# Patient Record
Sex: Female | Born: 1960 | Race: White | Hispanic: No | Marital: Married | Smoking: Current every day smoker
Health system: Southern US, Community
[De-identification: ages and names within clinical notes are randomized; demographics above are authoritative.]

## PROBLEM LIST (undated history)

## (undated) DIAGNOSIS — J449 Chronic obstructive pulmonary disease, unspecified: Secondary | ICD-10-CM

## (undated) DIAGNOSIS — I10 Essential (primary) hypertension: Secondary | ICD-10-CM

## (undated) DIAGNOSIS — T7840XA Allergy, unspecified, initial encounter: Secondary | ICD-10-CM

## (undated) DIAGNOSIS — I639 Cerebral infarction, unspecified: Secondary | ICD-10-CM

## (undated) HISTORY — DX: Allergy, unspecified, initial encounter: T78.40XA

## (undated) HISTORY — DX: Essential (primary) hypertension: I10

---

## 1995-11-24 HISTORY — PX: TUBAL LIGATION: SHX77

## 2005-06-01 ENCOUNTER — Ambulatory Visit: Payer: Self-pay | Admitting: Internal Medicine

## 2005-07-20 ENCOUNTER — Ambulatory Visit: Payer: Self-pay | Admitting: Internal Medicine

## 2007-02-25 DIAGNOSIS — G43809 Other migraine, not intractable, without status migrainosus: Secondary | ICD-10-CM | POA: Insufficient documentation

## 2007-11-24 HISTORY — PX: ABDOMINAL HYSTERECTOMY: SHX81

## 2008-03-12 ENCOUNTER — Ambulatory Visit: Payer: Self-pay

## 2008-03-15 ENCOUNTER — Ambulatory Visit: Payer: Self-pay

## 2008-12-11 DIAGNOSIS — F17200 Nicotine dependence, unspecified, uncomplicated: Secondary | ICD-10-CM | POA: Insufficient documentation

## 2010-01-30 ENCOUNTER — Ambulatory Visit: Payer: Self-pay | Admitting: Family Medicine

## 2010-02-20 DIAGNOSIS — G5600 Carpal tunnel syndrome, unspecified upper limb: Secondary | ICD-10-CM | POA: Insufficient documentation

## 2010-09-26 ENCOUNTER — Ambulatory Visit: Payer: Self-pay | Admitting: Specialist

## 2010-11-06 ENCOUNTER — Ambulatory Visit: Payer: Self-pay | Admitting: Specialist

## 2011-04-29 ENCOUNTER — Inpatient Hospital Stay: Payer: Self-pay | Admitting: Internal Medicine

## 2011-05-08 ENCOUNTER — Ambulatory Visit: Payer: Self-pay | Admitting: Family Medicine

## 2012-12-20 ENCOUNTER — Ambulatory Visit: Payer: Self-pay | Admitting: Family Medicine

## 2014-07-23 LAB — CBC AND DIFFERENTIAL
HEMATOCRIT: 40 % (ref 36–46)
HEMOGLOBIN: 14 g/dL (ref 12.0–16.0)
Neutrophils Absolute: 61 /uL
Platelets: 208 10*3/uL (ref 150–399)
WBC: 8.2 10^3/mL

## 2014-07-23 LAB — HEPATIC FUNCTION PANEL
ALT: 15 U/L (ref 7–35)
AST: 18 U/L (ref 13–35)
Alkaline Phosphatase: 101 U/L (ref 25–125)
Bilirubin, Total: 0.3 mg/dL

## 2014-07-23 LAB — BASIC METABOLIC PANEL
BUN: 13 mg/dL (ref 4–21)
Creatinine: 0.9 mg/dL (ref 0.5–1.1)
GLUCOSE: 86 mg/dL
POTASSIUM: 4 mmol/L (ref 3.4–5.3)
SODIUM: 140 mmol/L (ref 137–147)

## 2015-09-11 ENCOUNTER — Other Ambulatory Visit: Payer: Self-pay | Admitting: Family Medicine

## 2015-09-17 ENCOUNTER — Other Ambulatory Visit: Payer: Self-pay | Admitting: Family Medicine

## 2015-09-18 ENCOUNTER — Other Ambulatory Visit: Payer: Self-pay | Admitting: Family Medicine

## 2015-09-18 NOTE — Telephone Encounter (Signed)
Pt contacted office for refill request on the following medications: Pt request 90 day supply.  [facility]  [address] .  CB#[phone]/MW   Metoprolol Tartrate 50mg    Amlodipine Besylate 10mg 

## 2015-09-19 ENCOUNTER — Other Ambulatory Visit: Payer: Self-pay

## 2015-09-19 NOTE — Telephone Encounter (Addendum)
[facility] 50 [facility] and Amlodipine 10 mg.

## 2015-09-20 MED ORDER — METOPROLOL TARTRATE 50 MG PO TABS: 50.0000 mg | ORAL_TABLET | Freq: Two times a day (BID) | ORAL | Status: DC

## 2015-09-20 MED ORDER — AMLODIPINE BESYLATE 10 MG PO TABS: 10.0000 mg | ORAL_TABLET | Freq: Every day | ORAL | Status: DC

## 2015-09-20 NOTE — Telephone Encounter (Signed)
Refilled Metoprolol Tartrate 50 mg BID #180 and Amlodipine Besylate 10 mg qd #90. Advise patient to schedule follow up appointment in the next 1-2 months.

## 2015-09-23 NOTE — Telephone Encounter (Signed)
Patient advised as directed below. Patient verbalized understanding and states she will call back to schedule a follow up appointment.  

## 2015-12-19 DIAGNOSIS — R636 Underweight: Secondary | ICD-10-CM | POA: Insufficient documentation

## 2015-12-19 DIAGNOSIS — I10 Essential (primary) hypertension: Secondary | ICD-10-CM | POA: Insufficient documentation

## 2015-12-19 DIAGNOSIS — IMO0002 Reserved for concepts with insufficient information to code with codable children: Secondary | ICD-10-CM | POA: Insufficient documentation

## 2015-12-19 DIAGNOSIS — B029 Zoster without complications: Secondary | ICD-10-CM | POA: Insufficient documentation

## 2015-12-23 ENCOUNTER — Encounter: Payer: Self-pay | Admitting: Family Medicine

## 2015-12-23 ENCOUNTER — Ambulatory Visit (INDEPENDENT_AMBULATORY_CARE_PROVIDER_SITE_OTHER): Payer: BLUE CROSS/BLUE SHIELD | Admitting: Family Medicine

## 2015-12-23 VITALS — BP 162/92 | HR 61 | Temp 98.4°F | Resp 14 | Wt 108.4 lb

## 2015-12-23 DIAGNOSIS — Z23 Encounter for immunization: Secondary | ICD-10-CM | POA: Diagnosis not present

## 2015-12-23 DIAGNOSIS — I10 Essential (primary) hypertension: Secondary | ICD-10-CM

## 2015-12-23 DIAGNOSIS — R636 Underweight: Secondary | ICD-10-CM | POA: Diagnosis not present

## 2015-12-23 MED ORDER — CYPROHEPTADINE HCL 4 MG PO TABS: 4.0000 mg | ORAL_TABLET | Freq: Three times a day (TID) | ORAL | Status: DC | PRN

## 2015-12-23 MED ORDER — AMLODIPINE BESYLATE 10 MG PO TABS: 10.0000 mg | ORAL_TABLET | Freq: Every day | ORAL | Status: DC

## 2015-12-23 MED ORDER — METOPROLOL TARTRATE 50 MG PO TABS
50.0000 mg | ORAL_TABLET | Freq: Two times a day (BID) | ORAL | Status: DC
Start: 2015-12-23 — End: 2017-01-25

## 2015-12-23 NOTE — Progress Notes (Signed)
Patient ID: Leah Shaw, female   DOB: 07-21-1961, 55 y.o.   MRN: [id number]   Patient: Leah Shaw Female    DOB: 1961-02-20   55 y.o.   MRN: [id number] Visit Date: 12/23/2015  Today's Provider: Dortha Kern, PA   Chief Complaint  Patient presents with  . Hypertension  . Follow-up   Subjective:    HPI  Hypertension, follow-up:  BP Readings from Last 3 Encounters:  12/23/15 162/92    She was last seen for hypertension 06/25/2014  BP at that visit was 130/84. Management changes since that visit include none. She reports excellent compliance with treatment. She is not having side effects.  She is not exercising. She is adherent to low salt diet.   Outside blood pressures are pt is not checking blood pressure at home. She is experiencing none.  Patient denies none.   Cardiovascular risk factors include hypertension and smoking/ tobacco exposure.  Use of agents associated with hypertension: none.     Weight trend: stable Wt Readings from Last 3 Encounters:  12/23/15 108 lb 6.4 oz (49.17 kg)   Current diet: in general, a "healthy" diet    ------------------------------------------------------------------------ Patient Active Problem List   Diagnosis Date Noted  . 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 History of endocrine, metabolic or immunity disorder 78/29/5621  . BP (high blood pressure) 12/19/2015  . Low weight 12/19/2015  . Carpal tunnel syndrome 02/20/2010  . 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 Tubal ligation  1997  . Abdominal hysterectomy  2009   Family History  Problem Relation Age of Onset  . CAD Mother   . Heart disease Mother   . Heart attack Father   . Heart attack Maternal Grandmother   . Emphysema Paternal Grandmother      Previous Medications   AMLODIPINE (NORVASC) 10 MG TABLET    Take 1 tablet (10 mg total) by mouth daily.   ASPIRIN 81 MG  TABLET    Take by mouth.   CYPROHEPTADINE (PERIACTIN) 4 MG TABLET    Take by mouth.   METOPROLOL (LOPRESSOR) 50 MG TABLET    Take 1 tablet (50 mg total) by mouth 2 (two) times daily.   MULTIPLE VITAMIN TABLET       ZOLMITRIPTAN (ZOMIG ZMT) 5 MG DISINTEGRATING TABLET    Take by mouth.   Allergies  Allergen Reactions  . Sulfa Antibiotics Swelling    swelling face and tongue    Review of Systems  Constitutional: Negative.   HENT: Negative.   Eyes: Negative.   Respiratory: Negative.   Cardiovascular: Negative.   Gastrointestinal: Negative.   Endocrine: Negative.   Genitourinary: Negative.   Musculoskeletal: Negative.   Skin: Negative.   Allergic/Immunologic: Negative.   Neurological: Negative.   Hematological: Negative.   Psychiatric/Behavioral: Negative.     Social History  Substance Use Topics  . Smoking status: Current Every Day Smoker  . Smokeless tobacco: Never Used  . Alcohol Use: No   Objective:   BP 162/92 mmHg  Pulse 61  Temp(Src) 98.4 F (36.9 C) (Oral)  Resp 14  Wt 108 lb 6.4 oz (49.17 kg)  SpO2 96%  Physical Exam  Constitutional: She is oriented to person, place, and time. She appears well-developed and well-nourished. No distress.  HENT:  Head: Normocephalic and atraumatic.  Right Ear: Hearing normal.  Left Ear: Hearing normal.  Nose: Nose normal.  Eyes: Conjunctivae, EOM  and lids are normal. Right eye exhibits no discharge. Left eye exhibits no discharge. No scleral icterus.  Neck: Normal range of motion. Neck supple. No thyromegaly present.  Cardiovascular: Normal rate, regular rhythm and normal heart sounds.   Pulmonary/Chest: Effort normal and breath sounds normal. No respiratory distress.  Abdominal: Soft. Bowel sounds are normal.  Musculoskeletal: Normal range of motion.  Neurological: She is alert and oriented to person, place, and time.  Skin: Skin is intact. No lesion and no rash noted.  Psychiatric: She has a normal mood and affect. Her  speech is normal and behavior is normal. Thought content normal.      Assessment & Plan:     1. Essential hypertension Elevation today. Tolerating Amlodipine 10 mg qd and Metoprolol 50 mg BID. Decrease sodium intake and stop smoking. Recheck labs and follow up pending reports - CBC with Differential/Platelet  2. Low weight Has gained 6.5 lbs since taking the Periactin July 2016. Requests refill and will check labs for a metabolic disorder. Follow up pending reports. - COMPLETE METABOLIC PANEL WITH GFR - Lipid panel - TSH - cyproheptadine (PERIACTIN) 4 MG tablet; Take 1 tablet (4 mg total) by mouth 3 (three) times daily as needed for allergies.  Dispense: 60 tablet; Refill: 1  3. Need for influenza vaccination - Flu Vaccine QUAD 36+ mos PF IM (Fluarix & Fluzone Quad PF)

## 2015-12-25 LAB — CBC WITH DIFFERENTIAL/PLATELET
BASOS ABS: 0 10*3/uL (ref 0.0–0.2)
BASOS: 0 %
EOS (ABSOLUTE): 0.2 10*3/uL (ref 0.0–0.4)
Eos: 3 %
Hematocrit: 41.5 % (ref 34.0–46.6)
Hemoglobin: 14.8 g/dL (ref 11.1–15.9)
Immature Grans (Abs): 0 10*3/uL (ref 0.0–0.1)
Immature Granulocytes: 0 %
LYMPHS ABS: 1.7 10*3/uL (ref 0.7–3.1)
LYMPHS: 23 %
MCH: 32.7 pg (ref 26.6–33.0)
MCHC: 35.7 g/dL (ref 31.5–35.7)
MCV: 92 fL (ref 79–97)
MONOS ABS: 0.5 10*3/uL (ref 0.1–0.9)
Monocytes: 6 %
NEUTROS ABS: 4.9 10*3/uL (ref 1.4–7.0)
Neutrophils: 68 %
PLATELETS: 206 10*3/uL (ref 150–379)
RBC: 4.53 x10E6/uL (ref 3.77–5.28)
RDW: 13 % (ref 12.3–15.4)
WBC: 7.3 10*3/uL (ref 3.4–10.8)

## 2015-12-25 LAB — LIPID PANEL
CHOLESTEROL TOTAL: 199 mg/dL (ref 100–199)
Chol/HDL Ratio: 3.1 ratio units (ref 0.0–4.4)
HDL: 64 mg/dL (ref >39)
LDL Calculated: 120 mg/dL — ABNORMAL HIGH (ref 0–99)
Triglycerides: 75 mg/dL (ref 0–149)
VLDL Cholesterol Cal: 15 mg/dL (ref 5–40)

## 2015-12-25 LAB — COMPREHENSIVE METABOLIC PANEL
A/G RATIO: 1.5 (ref 1.1–2.5)
ALBUMIN: 4 g/dL (ref 3.5–5.5)
ALK PHOS: 113 IU/L (ref 39–117)
ALT: 18 IU/L (ref 0–32)
AST: 21 IU/L (ref 0–40)
BILIRUBIN TOTAL: 0.4 mg/dL (ref 0.0–1.2)
BUN / CREAT RATIO: 10 (ref 9–23)
BUN: 9 mg/dL (ref 6–24)
CHLORIDE: 102 mmol/L (ref 96–106)
CO2: 25 mmol/L (ref 18–29)
Calcium: 9.3 mg/dL (ref 8.7–10.2)
Creatinine, Ser: 0.87 mg/dL (ref 0.57–1.00)
GFR calc non Af Amer: 77 mL/min/{1.73_m2} (ref >59)
GFR, EST AFRICAN AMERICAN: 89 mL/min/{1.73_m2} (ref >59)
GLUCOSE: 98 mg/dL (ref 65–99)
Globulin, Total: 2.6 g/dL (ref 1.5–4.5)
POTASSIUM: 3.9 mmol/L (ref 3.5–5.2)
Sodium: 141 mmol/L (ref 134–144)
TOTAL PROTEIN: 6.6 g/dL (ref 6.0–8.5)

## 2015-12-25 LAB — TSH: TSH: 1.96 u[IU]/mL (ref 0.450–4.500)

## 2015-12-26 ENCOUNTER — Telehealth: Payer: Self-pay

## 2015-12-26 NOTE — Telephone Encounter (Signed)
-----   Message from Tamsen Roers, [place] sent at 12/26/2015 12:35 AM EST ----- All blood tests normal except LDL elevated. Need to follow low fat diet and exercise for 20-30 minutes 3-4 days a week. Recheck in 3 months to check progress.

## 2015-12-26 NOTE — Telephone Encounter (Signed)
Pt returned call. Thanks TNP °

## 2015-12-26 NOTE — Telephone Encounter (Signed)
LMTCB

## 2015-12-27 NOTE — Telephone Encounter (Signed)
Patient advised as directed below. Patient verbalized understanding.  

## 2016-01-16 ENCOUNTER — Ambulatory Visit (INDEPENDENT_AMBULATORY_CARE_PROVIDER_SITE_OTHER): Payer: BLUE CROSS/BLUE SHIELD | Admitting: Family Medicine

## 2016-01-16 ENCOUNTER — Encounter: Payer: Self-pay | Admitting: Family Medicine

## 2016-01-16 VITALS — BP 114/78 | HR 72 | Temp 99.1°F | Resp 16 | Wt 104.0 lb

## 2016-01-16 DIAGNOSIS — J029 Acute pharyngitis, unspecified: Secondary | ICD-10-CM | POA: Diagnosis not present

## 2016-01-16 DIAGNOSIS — R52 Pain, unspecified: Secondary | ICD-10-CM | POA: Diagnosis not present

## 2016-01-16 DIAGNOSIS — R509 Fever, unspecified: Secondary | ICD-10-CM

## 2016-01-16 LAB — POC INFLUENZA A&B (BINAX/QUICKVUE)
INFLUENZA A, POC: NEGATIVE
INFLUENZA B, POC: NEGATIVE

## 2016-01-16 LAB — POCT RAPID STREP A (OFFICE): RAPID STREP A SCREEN: NEGATIVE

## 2016-01-16 MED ORDER — OSELTAMIVIR PHOSPHATE 75 MG PO CAPS: 75.0000 mg | ORAL_CAPSULE | Freq: Two times a day (BID) | ORAL | Status: DC

## 2016-01-16 MED ORDER — AMOXICILLIN 250 MG/5ML PO SUSR: 500.0000 mg | Freq: Three times a day (TID) | ORAL | Status: DC

## 2016-01-16 NOTE — Progress Notes (Signed)
Patient ID: Leah Shaw, female   DOB: [birth year], 55 y.o.   MRN: [id number]   Patient: Leah Shaw Female    DOB: 27-Feb-1961   55 y.o.   MRN: [id number] Visit Date: 01/16/2016  Today's Provider: Dortha Kern, PA   Chief Complaint  Patient presents with  . Sore Throat   Subjective:    Sore Throat  This is a new problem. The current episode started yesterday. The problem has been unchanged. Neither side of throat is experiencing more pain than the other. Associated symptoms include congestion and coughing. Associated symptoms comments: Fever, weakness, chills, body aches .   Patient Active Problem List   Diagnosis Date Noted  . 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 History of endocrine, metabolic or immunity disorder 95/28/4132  . BP (high blood pressure) 12/19/2015  . Low weight 12/19/2015  . Carpal tunnel syndrome 02/20/2010  . 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 Tubal ligation  1997  . Abdominal hysterectomy  2009   Family History  Problem Relation Age of Onset  . CAD Mother   . Heart disease Mother   . Heart attack Father   . Heart attack Maternal Grandmother   . Emphysema Paternal Grandmother      Previous Medications   AMLODIPINE (NORVASC) 10 MG TABLET    Take 1 tablet (10 mg total) by mouth daily.   ASPIRIN 81 MG TABLET    Take by mouth.   CYPROHEPTADINE (PERIACTIN) 4 MG TABLET    Take 1 tablet (4 mg total) by mouth 3 (three) times daily as needed for allergies.   METOPROLOL (LOPRESSOR) 50 MG TABLET    Take 1 tablet (50 mg total) by mouth 2 (two) times daily.   MULTIPLE VITAMIN TABLET       ZOLMITRIPTAN (ZOMIG ZMT) 5 MG DISINTEGRATING TABLET    Take by mouth.   Allergies  Allergen Reactions  . Sulfa Antibiotics Swelling    swelling face and tongue    Review of Systems  Constitutional: Positive for fever and chills.  HENT: Positive for congestion and sore throat.    Eyes: Negative.   Respiratory: Positive for cough.   Cardiovascular: Negative.   Gastrointestinal: Negative.   Endocrine: Negative.   Genitourinary: Negative.   Musculoskeletal: Negative.   Skin: Negative.   Neurological: Positive for weakness.  Hematological: Negative.   Psychiatric/Behavioral: Negative.     Social History  Substance Use Topics  . Smoking status: Current Every Day Smoker  . Smokeless tobacco: Never Used  . Alcohol Use: No   Objective:   BP 114/78 mmHg  Pulse 72  Temp(Src) 99.1 F (37.3 C) (Oral)  Resp 16  Wt 104 lb (47.174 kg)  Physical Exam  Constitutional: She is oriented to person, place, and time. She appears well-developed and well-nourished.  HENT:  Head: Normocephalic and atraumatic.  Left Ear: External ear normal.  Nose: Nose normal.  Right TM with dull reflex and red posterior canal. Very red tonsillar pillars without exudates and soft palate blister.  Eyes: Conjunctivae and EOM are normal.  Neck:  Tender and slightly swollen submandibular nodes.  Cardiovascular: Normal rate, regular rhythm and normal heart sounds.   Pulmonary/Chest: Breath sounds normal.  Abdominal: Soft. Bowel sounds are normal.  Lymphadenopathy:    She has cervical adenopathy.  Neurological: She is alert and oriented to person, place, and time.  Skin: No rash noted.  Assessment & Plan:     1. Sore throat Onset suddenly yesterday. Rapid strep negative. Sent culture to the lab (reports a history of frequent strep infections). Will start antibiotic and should use saltwater gargles, lozenges and Tylenol or Advil for pain. Increase fluid intake and home to rest for the next 4 days (note written for out of work). Recheck pending lab reports. Should go to ER if throat swells more, has difficulty breathing or can't swallow her own saliva. - POCT rapid strep A - amoxicillin (AMOXIL) 250 MG/5ML suspension; Take 10 mLs (500 mg total) by mouth 3 (three) times daily.   Dispense: 300 mL; Refill: 0 - Culture, Group A Strep  2. Fever, unspecified fever cause Undocumented but having chills and very weak. Flu test negative. Will get CBC with diff and given Tamiflu prescription to get filled if blood count and throat culture indicate influenza. - POC Influenza A&B - CBC with Differential/Platelet - oseltamivir (TAMIFLU) 75 MG capsule; Take 1 capsule (75 mg total) by mouth 2 (two) times daily.  Dispense: 10 capsule; Refill: 0  3. Body aches Associated with fever and sore throat. Increase fluids and use Tylenol or Advil prn. Flu test negative for influenza A&B. - POC Influenza A&B

## 2016-01-17 ENCOUNTER — Telehealth: Payer: Self-pay | Admitting: Family Medicine

## 2016-01-17 LAB — CBC WITH DIFFERENTIAL/PLATELET
BASOS ABS: 0 10*3/uL (ref 0.0–0.2)
Basos: 0 %
EOS (ABSOLUTE): 0.1 10*3/uL (ref 0.0–0.4)
Eos: 0 %
Hematocrit: 40 % (ref 34.0–46.6)
Hemoglobin: 14.1 g/dL (ref 11.1–15.9)
Immature Grans (Abs): 0 10*3/uL (ref 0.0–0.1)
Immature Granulocytes: 0 %
LYMPHS ABS: 2.7 10*3/uL (ref 0.7–3.1)
Lymphs: 11 %
MCH: 32.2 pg (ref 26.6–33.0)
MCHC: 35.3 g/dL (ref 31.5–35.7)
MCV: 91 fL (ref 79–97)
MONOS ABS: 1.5 10*3/uL — AB (ref 0.1–0.9)
Monocytes: 6 %
Neutrophils Absolute: 19.6 10*3/uL — ABNORMAL HIGH (ref 1.4–7.0)
Neutrophils: 83 %
PLATELETS: 195 10*3/uL (ref 150–379)
RBC: 4.38 x10E6/uL (ref 3.77–5.28)
RDW: 13.3 % (ref 12.3–15.4)
WBC: 23.9 10*3/uL — AB (ref 3.4–10.8)

## 2016-01-17 NOTE — Telephone Encounter (Signed)
Pt is returning call.  CB#[phone]/MW

## 2016-01-17 NOTE — Telephone Encounter (Signed)
Pt advised of lab results-see lab notes-aa

## 2016-01-19 LAB — CULTURE, GROUP A STREP: STREP A CULTURE: NEGATIVE

## 2016-01-20 NOTE — Telephone Encounter (Signed)
-----   Message from Tamsen Roers, [place] sent at 01/20/2016 12:46 AM EST ----- Throat culture negative for strep. Finish the antibiotic given and recheck in a week to recheck blood cell counts.

## 2016-01-20 NOTE — Telephone Encounter (Signed)
Advised patient as below.  

## 2016-01-20 NOTE — Telephone Encounter (Signed)
Left message to call back  

## 2016-01-27 ENCOUNTER — Other Ambulatory Visit: Payer: Self-pay

## 2016-01-27 DIAGNOSIS — D72829 Elevated white blood cell count, unspecified: Secondary | ICD-10-CM

## 2016-01-28 LAB — CBC WITH DIFFERENTIAL
Basophils Absolute: 0 10*3/uL (ref 0.0–0.2)
Basos: 0 %
EOS (ABSOLUTE): 0.1 10*3/uL (ref 0.0–0.4)
Eos: 1 %
HEMATOCRIT: 42.8 % (ref 34.0–46.6)
Hemoglobin: 14.5 g/dL (ref 11.1–15.9)
IMMATURE GRANULOCYTES: 0 %
Immature Grans (Abs): 0 10*3/uL (ref 0.0–0.1)
LYMPHS ABS: 3.4 10*3/uL — AB (ref 0.7–3.1)
Lymphs: 28 %
MCH: 32 pg (ref 26.6–33.0)
MCHC: 33.9 g/dL (ref 31.5–35.7)
MCV: 95 fL (ref 79–97)
MONOS ABS: 0.5 10*3/uL (ref 0.1–0.9)
Monocytes: 4 %
NEUTROS PCT: 67 %
Neutrophils Absolute: 8.4 10*3/uL — ABNORMAL HIGH (ref 1.4–7.0)
RBC: 4.53 x10E6/uL (ref 3.77–5.28)
RDW: 12.9 % (ref 12.3–15.4)
WBC: 12.4 10*3/uL — AB (ref 3.4–10.8)

## 2016-02-03 ENCOUNTER — Ambulatory Visit (INDEPENDENT_AMBULATORY_CARE_PROVIDER_SITE_OTHER): Payer: BLUE CROSS/BLUE SHIELD | Admitting: Family Medicine

## 2016-02-03 ENCOUNTER — Encounter: Payer: Self-pay | Admitting: Family Medicine

## 2016-02-03 VITALS — BP 142/80 | HR 68 | Temp 98.6°F | Resp 14 | Wt 103.2 lb

## 2016-02-03 DIAGNOSIS — H6691 Otitis media, unspecified, right ear: Secondary | ICD-10-CM | POA: Diagnosis not present

## 2016-02-03 MED ORDER — AMOXICILLIN-POT CLAVULANATE 875-125 MG PO TABS: 1.0000 | ORAL_TABLET | Freq: Two times a day (BID) | ORAL | Status: DC

## 2016-02-03 NOTE — Progress Notes (Signed)
Patient ID: Leah Shaw, female   DOB: 1961/07/26, 55 y.o.   MRN: [id number]   Patient: Leah Shaw Female    DOB: Feb 26, 1961   55 y.o.   MRN: [id number] Visit Date: 02/03/2016  Today's Provider: Dortha Kern, PA   Chief Complaint  Patient presents with  . Ear Pain   Subjective:    Otalgia  There is pain in the right ear. This is a recurrent problem. The current episode started 1 to 4 weeks ago. The problem occurs constantly. She has tried antibiotics for the symptoms. The treatment provided mild relief.   Patient is following up from 01/16/2016 visit. Patient reports she still has ear pain after taking antibiotic and tamiflu prescribed on 01/16/2016.  Patient Active Problem List   Diagnosis Date Noted  . 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 History of endocrine, metabolic or immunity disorder 78/29/5621  . BP (high blood pressure) 12/19/2015  . Low weight 12/19/2015  . Carpal tunnel syndrome 02/20/2010  . 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 Tubal ligation  1997  . Abdominal hysterectomy  2009    Previous Medications   AMLODIPINE (NORVASC) 10 MG TABLET    Take 1 tablet (10 mg total) by mouth daily.   ASPIRIN 81 MG TABLET    Take by mouth.   CYPROHEPTADINE (PERIACTIN) 4 MG TABLET    Take 1 tablet (4 mg total) by mouth 3 (three) times daily as needed for allergies.   METOPROLOL (LOPRESSOR) 50 MG TABLET    Take 1 tablet (50 mg total) by mouth 2 (two) times daily.   MULTIPLE VITAMIN TABLET       ZOLMITRIPTAN (ZOMIG ZMT) 5 MG DISINTEGRATING TABLET    Take by mouth.   Allergies  Allergen Reactions  . Sulfa Antibiotics Swelling    swelling face and tongue    Review of Systems  Constitutional: Negative.   HENT: Positive for ear pain.   Eyes: Negative.   Respiratory: Negative.   Cardiovascular: Negative.   Gastrointestinal: Negative.   Endocrine: Negative.   Genitourinary:  Negative.   Musculoskeletal: Negative.   Skin: Negative.   Allergic/Immunologic: Negative.   Neurological: Negative.   Hematological: Negative.   Psychiatric/Behavioral: Negative.     Social History  Substance Use Topics  . Smoking status: Current Every Day Smoker  . Smokeless tobacco: Never Used  . Alcohol Use: No   Objective:   BP 142/80 mmHg  Pulse 68  Temp(Src) 98.6 F (37 C) (Oral)  Resp 14  Wt 103 lb 3.2 oz (46.811 kg)  Physical Exam  Constitutional: She is oriented to person, place, and time. She appears well-developed and well-nourished. No distress.  HENT:  Head: Normocephalic and atraumatic.  Right Ear: Hearing normal.  Left Ear: Hearing and external ear normal.  Nose: Nose normal.  Mouth/Throat: Oropharynx is clear and moist.  Dull hazy right TM with erythema and fluid bubble.  Eyes: Conjunctivae, EOM and lids are normal. Right eye exhibits no discharge. Left eye exhibits no discharge. No scleral icterus.  Neck: Normal range of motion. Neck supple.  Cardiovascular: Normal rate and regular rhythm.   Pulmonary/Chest: Effort normal and breath sounds normal. No respiratory distress.  Musculoskeletal: Normal range of motion.  Neurological: She is alert and oriented to person, place, and time.  Skin: Skin is intact. No lesion and no rash noted.  Psychiatric: She has a normal mood and affect. Her  speech is normal and behavior is normal. Thought content normal.      Assessment & Plan:     1. Acute right otitis media, recurrence not specified, unspecified otitis media type Persistent since 4 days after onset of respiratory infection 2 weeks ago. Has a history of past myringotomies for persistent infection several years ago. Will increase antibiotic and start nasal steroid with Periactin. Recheck in 10-14 days. - amoxicillin-clavulanate (AUGMENTIN) 875-125 MG tablet; Take 1 tablet by mouth 2 (two) times daily.  Dispense: 20 tablet; Refill: 0

## 2016-02-03 NOTE — Patient Instructions (Signed)

## 2016-02-17 ENCOUNTER — Encounter: Payer: Self-pay | Admitting: Family Medicine

## 2016-02-17 ENCOUNTER — Ambulatory Visit (INDEPENDENT_AMBULATORY_CARE_PROVIDER_SITE_OTHER): Payer: BLUE CROSS/BLUE SHIELD | Admitting: Family Medicine

## 2016-02-17 VITALS — BP 138/92 | HR 63 | Temp 98.7°F | Resp 14 | Wt 102.0 lb

## 2016-02-17 DIAGNOSIS — H6983 Other specified disorders of Eustachian tube, bilateral: Secondary | ICD-10-CM | POA: Diagnosis not present

## 2016-02-17 NOTE — Patient Instructions (Signed)

## 2016-02-17 NOTE — Progress Notes (Signed)
Patient ID: Leah Cureteborah L Fenlon, female   DOB: 01/20/1961, 55 y.o.   MRN: [id number]   Patient: Leah Shaw Female    DOB: 09/24/1961   55 y.o.   MRN: [id number] Visit Date: 02/17/2016  Today's Provider: Dortha Kernennis Chrismon, PA   Chief Complaint  Patient presents with  . Otitis Media  . Follow-up   Subjective:    Otalgia  There is pain in the right ear. This is a recurrent problem. The current episode started 1 to 4 weeks ago. The problem occurs constantly. There has been no fever. The patient is experiencing no pain. Associated symptoms comments: Ear fullness . She has tried antibiotics for the symptoms. The treatment provided mild relief.   Patient presents to follow up from OV on 02/03/2016 the diagnosis with acute right otitis media. Patient was prescribed Augmentin. Patent states she finished the medication. Symptoms remaining is ear fullness which is worse in the right ear.  Patient Active Problem List   Diagnosis Date Noted  . 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 History of endocrine, metabolic or immunity disorder 44/01/027201/26/2017  . BP (high blood pressure) 12/19/2015  . Low weight 12/19/2015  . Carpal tunnel syndrome 02/20/2010  . 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 Tubal ligation  1997  . Abdominal hysterectomy  2009   Family History  Problem Relation Age of Onset  . CAD Mother   . Heart disease Mother   . Heart attack Father   . Heart attack Maternal Grandmother   . Emphysema Paternal Grandmother    Previous Medications   AMLODIPINE (NORVASC) 10 MG TABLET    Take 1 tablet (10 mg total) by mouth daily.   ASPIRIN 81 MG TABLET    Take by mouth.   CYPROHEPTADINE (PERIACTIN) 4 MG TABLET    Take 1 tablet (4 mg total) by mouth 3 (three) times daily as needed for allergies.   METOPROLOL (LOPRESSOR) 50 MG TABLET    Take 1 tablet (50 mg total) by mouth 2 (two) times daily.   MULTIPLE VITAMIN  TABLET       ZOLMITRIPTAN (ZOMIG ZMT) 5 MG DISINTEGRATING TABLET    Take by mouth.   Allergies  Allergen Reactions  . Sulfa Antibiotics Swelling    swelling face and tongue    Review of Systems  Constitutional: Negative.   HENT: Positive for ear pain.   Eyes: Negative.   Respiratory: Negative.   Cardiovascular: Negative.   Gastrointestinal: Negative.   Endocrine: Negative.   Genitourinary: Negative.   Musculoskeletal: Negative.   Skin: Negative.   Allergic/Immunologic: Negative.   Neurological: Negative.   Hematological: Negative.   Psychiatric/Behavioral: Negative.     Social History  Substance Use Topics  . Smoking status: Current Every Day Smoker  . Smokeless tobacco: Never Used  . Alcohol Use: No   Objective:   BP 138/92 mmHg  Pulse 63  Temp(Src) 98.7 F (37.1 C) (Oral)  Resp 14  Wt 102 lb (46.267 kg)  Physical Exam  Constitutional: She is oriented to person, place, and time. She appears well-developed and well-nourished.  HENT:  Head: Normocephalic and atraumatic.  Right Ear: External ear normal.  Left Ear: External ear normal.  Nose: Nose normal.  Eyes: Conjunctivae and EOM are normal.  Neck: Neck supple.  Cardiovascular: Normal rate and regular rhythm.   Pulmonary/Chest: Breath sounds normal.  Neurological: She is alert and oriented to person, place,  and time.  Skin: No rash noted.      Assessment & Plan:     1. Eustachian tube dysfunction, bilateral Earache improved. No significant nasal congestion. Finished the Augmentin. Recommend she stop smoking. Use Flonase 2 sprays each nostril at bedtime and recheck prn.

## 2016-03-09 ENCOUNTER — Ambulatory Visit (INDEPENDENT_AMBULATORY_CARE_PROVIDER_SITE_OTHER): Payer: BLUE CROSS/BLUE SHIELD | Admitting: Family Medicine

## 2016-03-09 ENCOUNTER — Encounter: Payer: Self-pay | Admitting: Family Medicine

## 2016-03-09 VITALS — BP 128/78 | HR 61 | Temp 98.0°F | Resp 16 | Wt 105.2 lb

## 2016-03-09 DIAGNOSIS — R300 Dysuria: Secondary | ICD-10-CM | POA: Diagnosis not present

## 2016-03-09 LAB — POCT URINALYSIS DIPSTICK
BILIRUBIN UA: NEGATIVE
Glucose, UA: NEGATIVE
Ketones, UA: NEGATIVE
NITRITE UA: POSITIVE
PH UA: 6
Protein, UA: NEGATIVE
SPEC GRAV UA: 1.015
Urobilinogen, UA: 0.2

## 2016-03-09 MED ORDER — CIPROFLOXACIN HCL 500 MG PO TABS: 500.0000 mg | ORAL_TABLET | Freq: Two times a day (BID) | ORAL | Status: DC

## 2016-03-09 NOTE — Progress Notes (Signed)
Patient ID: Leah Shaw, female   DOB: 02/20/1961, 55 y.o.   MRN: [id number]   Patient: Leah Shaw Female    DOB: February 06, 1961   55 y.o.   MRN: [id number] Visit Date: 03/09/2016  Today's Provider: Dortha Kern, PA   Chief Complaint  Patient presents with  . Dysuria   Subjective:    Dysuria  This is a new problem. The current episode started in the past 7 days. The problem occurs every urination. The problem has been unchanged. The quality of the pain is described as burning. There has been no fever. Associated symptoms include frequency and hematuria. Treatments tried: Azo. The treatment provided no relief.     Patient Active Problem List   Diagnosis Date Noted  . 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 History of endocrine, metabolic or immunity disorder 62/13/0865  . BP (high blood pressure) 12/19/2015  . Low weight 12/19/2015  . Carpal tunnel syndrome 02/20/2010  . 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 Tubal ligation  1997  . Abdominal hysterectomy  2009   Family History  Problem Relation Age of Onset  . CAD Mother   . Heart disease Mother   . Heart attack Father   . Heart attack Maternal Grandmother   . Emphysema Paternal Grandmother    Previous Medications   AMLODIPINE (NORVASC) 10 MG TABLET    Take 1 tablet (10 mg total) by mouth daily.   ASPIRIN 81 MG TABLET    Take by mouth.   CYPROHEPTADINE (PERIACTIN) 4 MG TABLET    Take 1 tablet (4 mg total) by mouth 3 (three) times daily as needed for allergies.   METOPROLOL (LOPRESSOR) 50 MG TABLET    Take 1 tablet (50 mg total) by mouth 2 (two) times daily.   MULTIPLE VITAMIN TABLET       ZOLMITRIPTAN (ZOMIG ZMT) 5 MG DISINTEGRATING TABLET    Take by mouth.   Allergies  Allergen Reactions  . Sulfa Antibiotics Swelling    swelling face and tongue    Review of Systems  Constitutional: Negative.   HENT: Negative.   Eyes:  Negative.   Respiratory: Negative.   Cardiovascular: Negative.   Gastrointestinal: Negative.   Endocrine: Negative.   Genitourinary: Positive for dysuria, frequency and hematuria.  Musculoskeletal: Negative.   Skin: Negative.   Allergic/Immunologic: Negative.   Neurological: Negative.   Hematological: Negative.   Psychiatric/Behavioral: Negative.     Social History  Substance Use Topics  . Smoking status: Current Every Day Smoker  . Smokeless tobacco: Never Used  . Alcohol Use: No   Objective:   BP 128/78 mmHg  Pulse 61  Temp(Src) 98 F (36.7 C) (Oral)  Resp 16  Wt 105 lb 3.2 oz (47.718 kg)  Physical Exam  Constitutional: She is oriented to person, place, and time. She appears well-developed and well-nourished.  HENT:  Head: Normocephalic.  Eyes: Conjunctivae are normal.  Cardiovascular: Normal rate and regular rhythm.   Pulmonary/Chest: Effort normal and breath sounds normal.  Abdominal: Soft. Bowel sounds are normal. There is tenderness.  Suprapubic pressure and tenderness to palpation. No masses or rigidity. No CVA tenderness to percussion posteriorly.  Neurological: She is alert and oriented to person, place, and time.  Skin: No rash noted.  Psychiatric: She has a normal mood and affect. Her behavior is normal.    Results for orders placed or performed in visit on 03/09/16 (from  the past 24 hour(s))  POCT urinalysis dipstick     Status: Abnormal   Collection Time: 03/09/16 11:43 AM  Result Value Ref Range   Color, UA dark yellow    Clarity, UA clear    Glucose, UA neg    Bilirubin, UA neg    Ketones, UA neg    Spec Grav, UA 1.015    Blood, UA hemolyzed moderate    pH, UA 6.0    Protein, UA neg    Urobilinogen, UA 0.2    Nitrite, UA positive    Leukocytes, UA moderate (2+) (A) Negative        Assessment & Plan:     1. Dysuria Onset over the past 4 days. Noticed some blood on tissue after urinating occasionally. Increase in urgency and frequency. May  continue AZO-Standard for discomfort, increase fluid intake and start Cipro. Will get C&S and recheck pending report. - POCT urinalysis dipstick - Urine culture - ciprofloxacin (CIPRO) 500 MG tablet; Take 1 tablet (500 mg total) by mouth 2 (two) times daily.  Dispense: 20 tablet; Refill: 0

## 2016-03-09 NOTE — Patient Instructions (Signed)

## 2016-03-11 LAB — URINE CULTURE

## 2016-03-12 ENCOUNTER — Telehealth: Payer: Self-pay

## 2016-03-12 NOTE — Telephone Encounter (Signed)
-----   Message from Tamsen Roersennis E Chrismon, [place] sent at 03/12/2016  9:21 AM EDT ----- Urine culture isolated E.coli bacteria causing the UTI. It is susceptible to all the antibiotics tested (including the Ciprofloxacin she was given). Finish all the antibiotic and recheck in 10 days if any symptoms remain.

## 2016-03-12 NOTE — Telephone Encounter (Signed)
Attempted to contact patient. No answer and unable to leave a message due to voicemail being full.  

## 2016-03-12 NOTE — Telephone Encounter (Signed)
Patient advised as directed below. Patient verbalized understanding.  

## 2016-07-18 ENCOUNTER — Other Ambulatory Visit: Payer: Self-pay | Admitting: Family Medicine

## 2016-07-18 DIAGNOSIS — R636 Underweight: Secondary | ICD-10-CM

## 2016-12-19 ENCOUNTER — Other Ambulatory Visit: Payer: Self-pay | Admitting: Family Medicine

## 2016-12-19 DIAGNOSIS — R636 Underweight: Secondary | ICD-10-CM

## 2017-01-25 ENCOUNTER — Other Ambulatory Visit: Payer: Self-pay | Admitting: Family Medicine

## 2017-01-25 DIAGNOSIS — I10 Essential (primary) hypertension: Secondary | ICD-10-CM

## 2017-01-28 ENCOUNTER — Ambulatory Visit (INDEPENDENT_AMBULATORY_CARE_PROVIDER_SITE_OTHER): Payer: BLUE CROSS/BLUE SHIELD | Admitting: Family Medicine

## 2017-01-28 ENCOUNTER — Encounter: Payer: Self-pay | Admitting: Family Medicine

## 2017-01-28 VITALS — BP 142/86 | HR 59 | Temp 98.2°F | Wt 120.6 lb

## 2017-01-28 DIAGNOSIS — R636 Underweight: Secondary | ICD-10-CM

## 2017-01-28 DIAGNOSIS — F17200 Nicotine dependence, unspecified, uncomplicated: Secondary | ICD-10-CM

## 2017-01-28 DIAGNOSIS — R0789 Other chest pain: Secondary | ICD-10-CM

## 2017-01-28 DIAGNOSIS — I10 Essential (primary) hypertension: Secondary | ICD-10-CM | POA: Diagnosis not present

## 2017-01-28 MED ORDER — CYPROHEPTADINE HCL 4 MG PO TABS: ORAL_TABLET | ORAL | 3 refills | Status: DC

## 2017-01-28 MED ORDER — METOPROLOL TARTRATE 50 MG PO TABS: 50.0000 mg | ORAL_TABLET | Freq: Two times a day (BID) | ORAL | 3 refills | Status: DC

## 2017-01-28 MED ORDER — AMLODIPINE BESYLATE 10 MG PO TABS: 10.0000 mg | ORAL_TABLET | Freq: Every day | ORAL | 3 refills | Status: DC

## 2017-01-28 NOTE — Patient Instructions (Signed)
Smoking Tobacco Information Smoking tobacco will very likely harm your health. Tobacco contains a poisonous (toxic), colorless chemical called nicotine. Nicotine affects the brain and makes tobacco addictive. This change in your brain can make it hard to stop smoking. Tobacco also has other toxic chemicals that can hurt your body and raise your risk of many cancers. How can smoking tobacco affect me? Smoking tobacco can increase your chances of having serious health conditions, such as:  Cancer. Smoking is most commonly associated with lung cancer, but can lead to cancer in other parts of the body.  Chronic obstructive pulmonary disease (COPD). This is a long-term lung condition that makes it hard to breathe. It also gets worse over time.  High blood pressure (hypertension), heart disease, stroke, or heart attack.  Lung infections, such as pneumonia.  Cataracts. This is when the lenses in the eyes become clouded.  Digestive problems. This may include peptic ulcers, heartburn, and gastroesophageal reflux disease (GERD).  Oral health problems, such as gum disease and tooth loss.  Loss of taste and smell. Smoking can affect your appearance by causing:  Wrinkles.  Yellow or stained teeth, fingers, and fingernails. Smoking tobacco can also affect your social life.  Many workplaces, restaurants, hotels, and public places are tobacco-free. This means that you may experience challenges in finding places to smoke when away from home.  The cost of a smoking habit can be expensive. Expenses for someone who smokes come in two ways:  You spend money on a regular basis to buy tobacco.  Your health care costs in the long-term are higher if you smoke.  Tobacco smoke can also affect the health of those around you. Children of smokers have greater chances of:  Sudden infant death syndrome (SIDS).  Ear infections.  Lung infections. What lifestyle changes can be made?  Do not start smoking.  Quit if you already do.  To quit smoking:  Make a plan to quit smoking and commit yourself to it. Look for programs to help you and ask your health care provider for recommendations and ideas.  Talk with your health care provider about using nicotine replacement medicines to help you quit. Medicine replacement medicines include gum, lozenges, patches, sprays, or pills.  Do not replace cigarette smoking with electronic cigarettes, which are commonly called e-cigarettes. The safety of e-cigarettes is not known, and some may contain harmful chemicals.  Avoid places, people, or situations that tempt you to smoke.  If you try to quit but return to smoking, don't give up hope. It is very common for people to try a number of times before they fully succeed. When you feel ready again, give it another try.  Quitting smoking might affect the way you eat as well as your weight. Be prepared to monitor your eating habits. Get support in planning and following a healthy diet.  Ask your health care provider about having regular tests (screenings) to check for cancer. This may include blood tests, imaging tests, and other tests.  Exercise regularly. Consider taking walks, joining a gym, or doing yoga or exercise classes.  Develop skills to manage your stress. These skills include meditation. What are the benefits of quitting smoking? By quitting smoking, you may:  Lower your risk of getting cancer and other diseases caused by smoking.  Live longer.  Breathe better.  Lower your blood pressure and heart rate.  Stop your addiction to tobacco.  Stop creating secondhand smoke that hurts other people.  Improve your sense of taste   and smell.  Look better over time, due to having fewer wrinkles and less staining. What can happen if changes are not made? If you do not stop smoking, you may:  Get cancer and other diseases.  Develop COPD or other long-term (chronic) lung conditions.  Develop  serious problems with your heart and blood vessels (cardiovascular system).  Need more tests to screen for problems caused by smoking.  Have higher, long-term healthcare costs from medicines or treatments related to smoking.  Continue to have worsening changes in your lungs, mouth, and nose. Where to find support: To get support to quit smoking, consider:  Asking your health care provider for more information and resources.  Taking classes to learn more about quitting smoking.  Looking for local organizations that offer resources about quitting smoking.  Joining a support group for people who want to quit smoking in your local community. Where to find more information: You may find more information about quitting smoking from:  HelpGuide.org: www.helpguide.org/articles/addictions/how-to-quit-smoking.htm  Smokefree.gov: smokefree.gov  American Lung Association: www.lung.org Contact a health care provider if:  You have problems breathing.  Your lips, nose, or fingers turn blue.  You have chest pain.  You are coughing up blood.  You feel faint or you pass out.  You have other noticeable changes that cause you to worry. Summary  Smoking tobacco can negatively affect your health, the health of those around you, your finances, and your social life.  Do not start smoking. Quit if you already do. If you need help quitting, ask your health care provider.  Think about joining a support group for people who want to quit smoking in your local community. There are many effective programs that will help you to quit this behavior. This information is not intended to replace advice given to you by your health care provider. Make sure you discuss any questions you have with your health care provider. Document Released: 11/24/2016 Document Revised: 11/24/2016 Document Reviewed: 11/24/2016 Elsevier Interactive Patient Education  2017 Elsevier Inc.  

## 2017-01-28 NOTE — Progress Notes (Addendum)
Patient: Leah Shaw Female    DOB: 11-04-1961   56 y.o.   MRN: [id number] Visit Date: 01/28/2017  Today's Provider: Dortha Kern, PA   Chief Complaint  Patient presents with  . Hypertension  . Follow-up   Subjective:    HPI  Hypertension, follow-up:  BP Readings from Last 3 Encounters:  01/28/17 (!) 142/86  03/09/16 128/78  02/17/16 (!) 138/92    She was last seen for hypertension more than1 years ago.  BP at that visit was 162/92. Management changes since that visit include continue medications. Recommended to decrease sodium intake and stop smoking. She reports fair compliance with treatment. She is not having side effects.  She is not exercising. She is adherent to low salt diet.   Outside blood pressures are not being checked. She is experiencing none.  Patient denies chest pain, chest pressure/discomfort, irregular heart beat and palpitations.   Cardiovascular risk factors include hypertension and smoking/ tobacco exposure.  Use of agents associated with hypertension: none.     Weight trend: stable Wt Readings from Last 3 Encounters:  01/28/17 120 lb 9.6 oz (54.7 kg)  03/09/16 105 lb 3.2 oz (47.7 kg)  02/17/16 102 lb (46.3 kg)    Current diet: in general, a "healthy" diet    ------------------------------------------------------------------------ Patient Active Problem List   Diagnosis Date Noted  . 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 History of endocrine, metabolic or immunity disorder 40/98/1191  . BP (high blood pressure) 12/19/2015  . Low weight 12/19/2015  . Carpal tunnel syndrome 02/20/2010  . 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 ABDOMINAL HYSTERECTOMY  2009  . TUBAL LIGATION  1997   Family History  Problem Relation Age of Onset  . CAD Mother   . Heart disease Mother   . Heart attack Father   . Heart attack Maternal Grandmother   . Emphysema  Paternal Grandmother    Allergies  Allergen Reactions  . Sulfa Antibiotics Swelling    swelling face and tongue     Previous Medications   AMLODIPINE (NORVASC) 10 MG TABLET    TAKE ONE TABLET BY MOUTH ONCE DAILY   ASPIRIN 81 MG TABLET    Take by mouth.   CYPROHEPTADINE (PERIACTIN) 4 MG TABLET    TAKE ONE TABLET BY MOUTH THREE TIMES DAILY AS NEEDED FOR ALLERGIES   METOPROLOL (LOPRESSOR) 50 MG TABLET    TAKE ONE TABLET BY MOUTH TWICE DAILY   MULTIPLE VITAMIN TABLET       ZOLMITRIPTAN (ZOMIG ZMT) 5 MG DISINTEGRATING TABLET    Take by mouth.    Review of Systems  Constitutional: Negative.   HENT: Negative.   Respiratory: Positive for chest tightness.        Rare episodes of pressure in chest while at work. No sweats or palpitations.  Cardiovascular: Negative.     Social History  Substance Use Topics  . Smoking status: Current Every Day Smoker  . Smokeless tobacco: Never Used  . Alcohol use No   Objective:   BP (!) 142/86 (BP Location: Right Arm, Patient Position: Sitting, Cuff Size: Normal)   Pulse (!) 59   Temp 98.2 F (36.8 C) (Oral)   Wt 120 lb 9.6 oz (54.7 kg)   SpO2 95%  Wt Readings from Last 3 Encounters:  01/28/17 120 lb 9.6 oz (54.7 kg)  03/09/16 105 lb 3.2 oz (47.7 kg)  02/17/16 102 lb (46.3  kg)    Physical Exam  Constitutional: She is oriented to person, place, and time. She appears well-developed and well-nourished. No distress.  HENT:  Head: Normocephalic and atraumatic.  Right Ear: Hearing normal.  Left Ear: Hearing normal.  Nose: Nose normal.  Eyes: Conjunctivae and lids are normal. Right eye exhibits no discharge. Left eye exhibits no discharge. No scleral icterus.  Neck: Normal range of motion. Neck supple.  Cardiovascular: Normal rate and regular rhythm.   Pulmonary/Chest: Effort normal and breath sounds normal. No respiratory distress.  Abdominal: Soft. Bowel sounds are normal.  Musculoskeletal: Normal range of motion.  Neurological: She is alert  and oriented to person, place, and time.  Skin: Skin is intact. No lesion and no rash noted.  Psychiatric: She has a normal mood and affect. Her speech is normal and behavior is normal. Thought content normal.   Depression screen PHQ 2/9 01/28/2017  Decreased Interest 0  Down, Depressed, Hopeless 0  PHQ - 2 Score 0  Altered sleeping 2  Tired, decreased energy 0  Change in appetite 0  Feeling bad or failure about yourself  0  Trouble concentrating 0  Moving slowly or fidgety/restless 0  Suicidal thoughts 0  PHQ-9 Score 2      Assessment & Plan:     1. Essential hypertension Tolerating Metoprolol 50 mg BID and Norvasc 10 mg qd without edema or dyspnea. Recheck routine labs and recommend she stop smoking. Recheck pending lab reports. - CBC with Differential/Platelet - Comprehensive metabolic panel - TSH - Lipid panel - metoprolol (LOPRESSOR) 50 MG tablet; Take 1 tablet (50 mg total) by mouth 2 (two) times daily.  Dispense: 180 tablet; Refill: 3 - amLODipine (NORVASC) 10 MG tablet; Take 1 tablet (10 mg total) by mouth daily.  Dispense: 90 tablet; Refill: 3  2. Chest discomfort Rare occasion of chest discomfort. No diaphoresis. Only slight sinus bradycardia on EKG. No sign of ischemia. Will check routine labs and encouraged to stop all smoking again. - CBC with Differential/Platelet - Comprehensive metabolic panel - EKG 12-Lead  3. Low weight Eating better and has gained 18 lbs in the past year. Feeling better and happier about present weight. Still wants to gain 6-10 lbs more. Will continue Periactin and recommend she stop smoking. Recheck routine labs. - CBC with Differential/Platelet - Comprehensive metabolic panel - cyproheptadine (PERIACTIN) 4 MG tablet; TAKE ONE TABLET BY MOUTH THREE TIMES DAILY AS NEEDED FOR ALLERGIES  Dispense: 270 tablet; Refill: 3  4. Compulsive tobacco user syndrome Has gone back to smoking 3/4 ppd. Counseled regarding smoking cessation and offered  follow up with use of Nicotine patches or Chantix when ready.

## 2017-01-29 LAB — COMPREHENSIVE METABOLIC PANEL
ALK PHOS: 110 IU/L (ref 39–117)
ALT: 27 IU/L (ref 0–32)
AST: 25 IU/L (ref 0–40)
Albumin/Globulin Ratio: 1.8 (ref 1.2–2.2)
Albumin: 4.4 g/dL (ref 3.5–5.5)
BUN/Creatinine Ratio: 15 (ref 9–23)
BUN: 12 mg/dL (ref 6–24)
Bilirubin Total: 0.3 mg/dL (ref 0.0–1.2)
CALCIUM: 9.7 mg/dL (ref 8.7–10.2)
CO2: 26 mmol/L (ref 18–29)
CREATININE: 0.81 mg/dL (ref 0.57–1.00)
Chloride: 102 mmol/L (ref 96–106)
GFR calc Af Amer: 96 mL/min/{1.73_m2} (ref >59)
GFR, EST NON AFRICAN AMERICAN: 83 mL/min/{1.73_m2} (ref >59)
GLOBULIN, TOTAL: 2.5 g/dL (ref 1.5–4.5)
GLUCOSE: 80 mg/dL (ref 65–99)
Potassium: 4.3 mmol/L (ref 3.5–5.2)
SODIUM: 142 mmol/L (ref 134–144)
Total Protein: 6.9 g/dL (ref 6.0–8.5)

## 2017-01-29 LAB — LIPID PANEL
CHOLESTEROL TOTAL: 201 mg/dL — AB (ref 100–199)
Chol/HDL Ratio: 3.4 ratio units (ref 0.0–4.4)
HDL: 59 mg/dL (ref >39)
LDL CALC: 105 mg/dL — AB (ref 0–99)
TRIGLYCERIDES: 183 mg/dL — AB (ref 0–149)
VLDL CHOLESTEROL CAL: 37 mg/dL (ref 5–40)

## 2017-01-29 LAB — CBC WITH DIFFERENTIAL/PLATELET
BASOS ABS: 0 10*3/uL (ref 0.0–0.2)
Basos: 0 %
EOS (ABSOLUTE): 0.3 10*3/uL (ref 0.0–0.4)
EOS: 3 %
Hematocrit: 42.8 % (ref 34.0–46.6)
Hemoglobin: 14.6 g/dL (ref 11.1–15.9)
IMMATURE GRANULOCYTES: 0 %
Immature Grans (Abs): 0 10*3/uL (ref 0.0–0.1)
LYMPHS ABS: 4.4 10*3/uL — AB (ref 0.7–3.1)
Lymphs: 40 %
MCH: 31.6 pg (ref 26.6–33.0)
MCHC: 34.1 g/dL (ref 31.5–35.7)
MCV: 93 fL (ref 79–97)
MONOS ABS: 0.5 10*3/uL (ref 0.1–0.9)
Monocytes: 5 %
NEUTROS PCT: 52 %
Neutrophils Absolute: 5.8 10*3/uL (ref 1.4–7.0)
PLATELETS: 259 10*3/uL (ref 150–379)
RBC: 4.62 x10E6/uL (ref 3.77–5.28)
RDW: 13 % (ref 12.3–15.4)
WBC: 11 10*3/uL — AB (ref 3.4–10.8)

## 2017-01-29 LAB — TSH: TSH: 1.23 u[IU]/mL (ref 0.450–4.500)

## 2017-08-26 ENCOUNTER — Encounter: Payer: Self-pay | Admitting: Family Medicine

## 2017-08-26 ENCOUNTER — Ambulatory Visit (INDEPENDENT_AMBULATORY_CARE_PROVIDER_SITE_OTHER): Payer: BLUE CROSS/BLUE SHIELD | Admitting: Family Medicine

## 2017-08-26 ENCOUNTER — Other Ambulatory Visit: Payer: Self-pay | Admitting: Family Medicine

## 2017-08-26 VITALS — BP 128/88 | HR 54 | Temp 98.7°F | Ht 64.0 in | Wt 123.6 lb

## 2017-08-26 DIAGNOSIS — Z1211 Encounter for screening for malignant neoplasm of colon: Secondary | ICD-10-CM

## 2017-08-26 DIAGNOSIS — E785 Hyperlipidemia, unspecified: Secondary | ICD-10-CM

## 2017-08-26 DIAGNOSIS — Z124 Encounter for screening for malignant neoplasm of cervix: Secondary | ICD-10-CM

## 2017-08-26 DIAGNOSIS — Z23 Encounter for immunization: Secondary | ICD-10-CM

## 2017-08-26 DIAGNOSIS — Z114 Encounter for screening for human immunodeficiency virus [HIV]: Secondary | ICD-10-CM

## 2017-08-26 DIAGNOSIS — Z0000 Encounter for general adult medical examination without abnormal findings: Secondary | ICD-10-CM

## 2017-08-26 DIAGNOSIS — Z1239 Encounter for other screening for malignant neoplasm of breast: Secondary | ICD-10-CM

## 2017-08-26 DIAGNOSIS — Z13820 Encounter for screening for osteoporosis: Secondary | ICD-10-CM | POA: Diagnosis not present

## 2017-08-26 DIAGNOSIS — Z1231 Encounter for screening mammogram for malignant neoplasm of breast: Secondary | ICD-10-CM

## 2017-08-26 DIAGNOSIS — Z1159 Encounter for screening for other viral diseases: Secondary | ICD-10-CM

## 2017-08-26 LAB — HEMOCCULT GUIAC POC 1CARD (OFFICE): Fecal Occult Blood, POC: NEGATIVE

## 2017-08-26 LAB — HM HIV SCREENING LAB: HM HIV Screening: NEGATIVE

## 2017-08-26 LAB — HM PAP SMEAR: HM PAP: NORMAL

## 2017-08-26 NOTE — Progress Notes (Signed)
Patient: Leah Shaw, Female    DOB: [birth year], 56 y.o.   MRN: [id number] Visit Date: 08/26/2017  Today's Provider: Dortha Kern, PA   Chief Complaint  Patient presents with  . Annual Exam   Subjective:    Annual physical exam Leah Shaw is a 56 y.o. female who presents today for health maintenance and complete physical. She feels well. She reports exercising 5 days a week. She reports she is sleeping poorly lately.  -----------------------------------------------------------------   Review of Systems  Constitutional: Negative.   HENT: Positive for sinus pressure.   Eyes: Negative.   Respiratory: Negative.   Cardiovascular: Negative.   Gastrointestinal: Negative.   Endocrine: Negative.   Genitourinary: Negative.   Musculoskeletal: Negative.   Skin: Negative.   Allergic/Immunologic: Negative.   Neurological: Negative.   Hematological: Negative.   Psychiatric/Behavioral: Negative.    Social History      She  reports that she has been smoking.  She has never used smokeless tobacco. She reports that she does not drink alcohol or use drugs.       Social History   Social History  . Marital status: Married    Spouse name: N/A  . Number of children: N/A  . Years of education: N/A   Social History Main Topics  . Smoking status: Current Every Day Smoker  . Smokeless tobacco: Never Used  . Alcohol use No  . Drug use: No  . Sexual activity: Not on file   Other Topics Concern  . Not on file   Social History Narrative  . No narrative on file   No past medical history on file.  Patient Active Problem List   Diagnosis Date Noted  . 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 History of endocrine, metabolic or immunity disorder 40/98/1191  . BP (high blood pressure) 12/19/2015  . Low weight 12/19/2015  . Carpal tunnel syndrome 02/20/2010  . 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   Past Surgical History:  Procedure  Laterality Date  . ABDOMINAL HYSTERECTOMY  2009  . TUBAL LIGATION  1997   Family History        Family Status  Relation Status  . Mother Alive  . Father Alive  . Brother Alive       polycythemia vera  . MGM Alive  . PGM Deceased  . PGF Deceased        Her family history includes CAD in her mother; Emphysema in her paternal grandmother; Heart attack in her father and maternal grandmother; Heart disease in her mother.     Allergies  Allergen Reactions  . Sulfa Antibiotics Swelling    swelling face and tongue     Current Outpatient Prescriptions:  .  amLODipine (NORVASC) 10 MG tablet, Take 1 tablet (10 mg total) by mouth daily., Disp: 90 tablet, Rfl: 3 .  aspirin 81 MG tablet, Take by mouth., Disp: , Rfl:  .  cyproheptadine (PERIACTIN) 4 MG tablet, TAKE ONE TABLET BY MOUTH THREE TIMES DAILY AS NEEDED FOR ALLERGIES, Disp: 270 tablet, Rfl: 3 .  metoprolol (LOPRESSOR) 50 MG tablet, Take 1 tablet (50 mg total) by mouth 2 (two) times daily., Disp: 180 tablet, Rfl: 3 .  Multiple Vitamin tablet, , Disp: , Rfl:  .  zolmitriptan (ZOMIG ZMT) 5 MG disintegrating tablet, Take by mouth., Disp: , Rfl:    Patient Care Team: Jozy Mcphearson, Jodell Cipro, PA as PCP - General (Family Medicine)      Objective:  Vitals:   08/26/17 0918  BP: 128/88  Pulse: (!) 54  Temp: 98.7 F (37.1 C)  SpO2: 98%  Body mass index is 21.22 kg/m. Filed Weights   08/26/17 0918  Weight: 123 lb 9.6 oz (56.1 kg)    Physical Exam  Constitutional: She is oriented to person, place, and time. She appears well-developed and well-nourished.  HENT:  Head: Normocephalic and atraumatic.  Right Ear: External ear normal.  Left Ear: External ear normal.  Nose: Nose normal.  Mouth/Throat: Oropharynx is clear and moist.  Eyes: Pupils are equal, round, and reactive to light. Conjunctivae and EOM are normal. Right eye exhibits no discharge.  Neck: Normal range of motion. Neck supple. No tracheal deviation present. No  thyromegaly present.  Cardiovascular: Normal rate, regular rhythm, normal heart sounds and intact distal pulses.   No murmur heard. Pulmonary/Chest: Effort normal and breath sounds normal. No respiratory distress. She has no wheezes. She has no rales. She exhibits no tenderness.  Abdominal: Soft. She exhibits no distension and no mass. There is no tenderness. There is no rebound and no guarding.  Musculoskeletal: Normal range of motion. She exhibits no edema or tenderness.  Lymphadenopathy:    She has no cervical adenopathy.  Neurological: She is alert and oriented to person, place, and time. She has normal reflexes. No cranial nerve deficit. She exhibits normal muscle tone. Coordination normal.  Skin: Skin is warm and dry. No rash noted. No erythema.  Psychiatric: She has a normal mood and affect. Her behavior is normal. Judgment and thought content normal.    Depression Screen PHQ 2/9 Scores 08/26/2017 01/28/2017  PHQ - 2 Score 0 0  PHQ- 9 Score 3 2    Assessment & Plan:     Routine Health Maintenance and Physical Exam  Exercise Activities and Dietary recommendations Goals    None      Immunization History  Administered Date(s) Administered  . Influenza Split 08/18/2008  . Influenza,inj,Quad PF,6+ Mos 12/23/2015    Health Maintenance  Topic Date Due  . Hepatitis C Screening  04/05/1961  . HIV Screening  07/29/1978  . TETANUS/TDAP  07/29/1982  . PAP SMEAR  07/29/1984  . MAMMOGRAM  07/29/2013  . COLONOSCOPY  07/29/2013  . INFLUENZA VACCINE  06/23/2017     Discussed health benefits of physical activity, and encouraged her to engage in regular exercise appropriate for her age and condition.    -------------------------------------------------------------------- 1. Annual physical exam General health stable. Periactin has helped with appetite and she has gained 3 lbs - BMI now 21. BP well controlled and tolerating Amlodipine with Metoprolol - no side effects. EKG in  March 2018 normal. Recheck routine labs and given anticipatory counseling. - CBC with Differential/Platelet - Comprehensive metabolic panel - Lipid panel - TSH  2. Breast cancer screening - MM Digital Screening; Future  3. Need for influenza vaccination - Flu Vaccine QUAD 6+ mos PF IM (Fluarix Quad PF)  4. Need for Tdap vaccination -Tdap vaccine greater than or equal to 7yo IM  5. Screening for HIV (human immunodeficiency virus) - HIV antibody  6. Need for hepatitis C screening test - Hepatitis C Antibody  7. Screening for colon cancer - Ambulatory referral to Gastroenterology  8. Screening for osteoporosis Still smoking 1/2 ppd and very thin individual. Schedule BMD. - DG Bone Density  9. Hyperlipidemia, unspecified hyperlipidemia type Still trying to watch fats in diet and taking Krill Oil qd. Encouraged to continue walking for exercise and recheck labs. -  Comprehensive metabolic panel - Lipid panel - TSH    Dortha Kern, PA  [facility]

## 2017-08-30 LAB — SPECIMEN STATUS REPORT

## 2017-08-31 LAB — PANEL 083904
Final Interpretation: NEGATIVE
HIV 1 AB: NEGATIVE
HIV 2 AB: NEGATIVE

## 2017-08-31 LAB — TSH: TSH: 1.45 u[IU]/mL (ref 0.450–4.500)

## 2017-08-31 LAB — HEPATITIS C ANTIBODY

## 2017-08-31 LAB — SPECIMEN STATUS REPORT

## 2017-09-02 LAB — CBC WITH DIFFERENTIAL/PLATELET
BASOS: 0 %
Basophils Absolute: 0 10*3/uL (ref 0.0–0.2)
EOS (ABSOLUTE): 0.2 10*3/uL (ref 0.0–0.4)
Eos: 2 %
Hematocrit: 41.7 % (ref 34.0–46.6)
Hemoglobin: 15.4 g/dL (ref 11.1–15.9)
IMMATURE GRANULOCYTES: 0 %
Immature Grans (Abs): 0 10*3/uL (ref 0.0–0.1)
LYMPHS: 30 %
Lymphocytes Absolute: 3.3 10*3/uL — ABNORMAL HIGH (ref 0.7–3.1)
MCH: 32.6 pg (ref 26.6–33.0)
MCHC: 36.9 g/dL — AB (ref 31.5–35.7)
MCV: 88 fL (ref 79–97)
Monocytes Absolute: 0.7 10*3/uL (ref 0.1–0.9)
Monocytes: 6 %
NEUTROS PCT: 62 %
Neutrophils Absolute: 6.6 10*3/uL (ref 1.4–7.0)
Platelets: 268 10*3/uL (ref 150–379)
RBC: 4.73 x10E6/uL (ref 3.77–5.28)
RDW: 12.8 % (ref 12.3–15.4)
WBC: 10.9 10*3/uL — AB (ref 3.4–10.8)

## 2017-09-02 LAB — COMPREHENSIVE METABOLIC PANEL
A/G RATIO: 1.7 (ref 1.2–2.2)
ALBUMIN: 4.5 g/dL (ref 3.5–5.5)
ALT: 14 IU/L (ref 0–32)
AST: 20 IU/L (ref 0–40)
Alkaline Phosphatase: 114 IU/L (ref 39–117)
BILIRUBIN TOTAL: 0.3 mg/dL (ref 0.0–1.2)
BUN / CREAT RATIO: 17 (ref 9–23)
BUN: 14 mg/dL (ref 6–24)
CO2: 22 mmol/L (ref 20–29)
Calcium: 10.2 mg/dL (ref 8.7–10.2)
Chloride: 102 mmol/L (ref 96–106)
Creatinine, Ser: 0.84 mg/dL (ref 0.57–1.00)
GFR calc non Af Amer: 79 mL/min/{1.73_m2} (ref >59)
GFR, EST AFRICAN AMERICAN: 91 mL/min/{1.73_m2} (ref >59)
Globulin, Total: 2.7 g/dL (ref 1.5–4.5)
Glucose: 86 mg/dL (ref 65–99)
POTASSIUM: 4.3 mmol/L (ref 3.5–5.2)
SODIUM: 139 mmol/L (ref 134–144)
TOTAL PROTEIN: 7.2 g/dL (ref 6.0–8.5)

## 2017-09-02 LAB — LIPID PANEL W/O CHOL/HDL RATIO
Cholesterol, Total: 215 mg/dL — ABNORMAL HIGH (ref 100–199)
HDL: 51 mg/dL (ref >39)
LDL Calculated: 116 mg/dL — ABNORMAL HIGH (ref 0–99)
Triglycerides: 240 mg/dL — ABNORMAL HIGH (ref 0–149)
VLDL Cholesterol Cal: 48 mg/dL — ABNORMAL HIGH (ref 5–40)

## 2017-09-06 ENCOUNTER — Encounter: Payer: Self-pay | Admitting: Family Medicine

## 2017-09-16 ENCOUNTER — Other Ambulatory Visit: Payer: BLUE CROSS/BLUE SHIELD

## 2017-11-25 ENCOUNTER — Encounter: Payer: Self-pay | Admitting: Gastroenterology

## 2017-11-30 DIAGNOSIS — E785 Hyperlipidemia, unspecified: Secondary | ICD-10-CM | POA: Insufficient documentation

## 2017-12-02 ENCOUNTER — Encounter: Payer: Self-pay | Admitting: Family Medicine

## 2017-12-02 ENCOUNTER — Ambulatory Visit: Payer: BLUE CROSS/BLUE SHIELD | Admitting: Family Medicine

## 2017-12-02 VITALS — BP 112/84 | HR 65 | Temp 98.2°F | Wt 125.4 lb

## 2017-12-02 DIAGNOSIS — G43809 Other migraine, not intractable, without status migrainosus: Secondary | ICD-10-CM

## 2017-12-02 DIAGNOSIS — E782 Mixed hyperlipidemia: Secondary | ICD-10-CM

## 2017-12-02 DIAGNOSIS — I10 Essential (primary) hypertension: Secondary | ICD-10-CM | POA: Diagnosis not present

## 2017-12-02 NOTE — Progress Notes (Signed)
Patient: Leah CuretDeborah L Kehres Female    DOB: 09/13/1961   57 y.o.   MRN: [id number] Visit Date: 12/02/2017  Today's Provider: Dortha Kernennis Chrismon, PA   Chief Complaint  Patient presents with  . Hyperlipidemia  . Hypertension  . Follow-up   Subjective:    HPI  Hypertension, follow-up:  BP Readings from Last 3 Encounters:  12/02/17 112/84  08/26/17 128/88  01/28/17 (!) 142/86   She was last seen for hypertension 3 months ago.  BP at that visit was 128/88. Management changes since that visit include continue medications. Recommended to decrease sodium intake and stop smoking. She reports fair compliance with treatment. She is not having side effects.  She is not exercising. She is adherent to low salt diet.   Outside blood pressures are not being checked. She is experiencing none.  Patient denies chest pain, chest pressure/discomfort, irregular heart beat and palpitations.   Cardiovascular risk factors include hypertension and smoking/ tobacco exposure.  Use of agents associated with hypertension: none.                Weight trend: stable Wt Readings from Last 3 Encounters:  12/02/17 125 lb 6.4 oz (56.9 kg)  08/26/17 123 lb 9.6 oz (56.1 kg)  01/28/17 120 lb 9.6 oz (54.7 kg)   Current diet: in general, a "healthy" diet   Lipid/Cholesterol, Follow-up:   Last seen for this 3 months ago.  Management changes since that visit include recommended Krill Oil and Red Yeast Rice due to elevated Triglycerides and Total Cholesterol. . Last Lipid Panel:    Component Value Date/Time   CHOL 215 (H) 08/26/2017 1058   TRIG 240 (H) 08/26/2017 1058   HDL 51 08/26/2017 1058   CHOLHDL 3.4 01/28/2017 1218   LDLCALC 116 (H) 08/26/2017 1058    Risk factors for vascular disease include hypertension and smoking  She reports good compliance with treatment. She is having side effects from Red Yeast Rice. She reports muscle pain at night time which interferes with sleep. She takes the  medication every other day at this time. Current symptoms include none Weight trend: stable Current diet: low salt Current exercise: none  Wt Readings from Last 3 Encounters:  12/02/17 125 lb 6.4 oz (56.9 kg)  08/26/17 123 lb 9.6 oz (56.1 kg)  01/28/17 120 lb 9.6 oz (54.7 kg)    -------------------------------------------------------------------     Allergies  Allergen Reactions  . Sulfa Antibiotics Swelling    swelling face and tongue     Current Outpatient Medications:  .  amLODipine (NORVASC) 10 MG tablet, Take 1 tablet (10 mg total) by mouth daily., Disp: 90 tablet, Rfl: 3 .  aspirin 81 MG tablet, Take by mouth., Disp: , Rfl:  .  cyproheptadine (PERIACTIN) 4 MG tablet, TAKE ONE TABLET BY MOUTH THREE TIMES DAILY AS NEEDED FOR ALLERGIES, Disp: 270 tablet, Rfl: 3 .  metoprolol (LOPRESSOR) 50 MG tablet, Take 1 tablet (50 mg total) by mouth 2 (two) times daily., Disp: 180 tablet, Rfl: 3 .  Multiple Vitamin tablet, , Disp: , Rfl:  .  zolmitriptan (ZOMIG ZMT) 5 MG disintegrating tablet, Take by mouth., Disp: , Rfl:   Review of Systems  Social History   Tobacco Use  . Smoking status: Current Every Day Smoker  . Smokeless tobacco: Never Used  Substance Use Topics  . Alcohol use: No    Alcohol/week: 0.0 oz   Objective:   BP 112/84 (BP Location: Right Arm, Patient Position: Sitting, Cuff  Size: Normal)   Pulse 65   Temp 98.2 F (36.8 C) (Oral)   Wt 125 lb 6.4 oz (56.9 kg)   SpO2 97%   BMI 21.52 kg/m   Physical Exam  Constitutional: She is oriented to person, place, and time. She appears well-developed and well-nourished. No distress.  HENT:  Head: Normocephalic and atraumatic.  Right Ear: Hearing normal.  Left Ear: Hearing normal.  Nose: Nose normal.  Eyes: Conjunctivae and lids are normal. Right eye exhibits no discharge. Left eye exhibits no discharge. No scleral icterus.  Neck: Neck supple.  Cardiovascular: Normal rate.  Pulmonary/Chest: Effort normal and  breath sounds normal. No respiratory distress.  Abdominal: Soft. Bowel sounds are normal.  Musculoskeletal: Normal range of motion.  Neurological: She is alert and oriented to person, place, and time.  Skin: Skin is intact. No lesion and no rash noted.  Psychiatric: She has a normal mood and affect. Her speech is normal and behavior is normal. Thought content normal.      Assessment & Plan:     1. Mixed hyperlipidemia Taking the Red Yeast Rice a few days a week and stop it for a few days due to muscle/body aches. Tolerating the [place] without side effects. Continues low fat diet. Recommend she stop all smoking and recheck labs to assess progress. - Comprehensive metabolic panel - Lipid panel - TSH  2. Essential hypertension Well controlled and stable. Tolerating the Amlodipine 10 mg qd and Metoprolol Tartrated 50 mg BID. Recheck routine labs and continue present regimen. Need to stop all smoking. - CBC with Differential/Platelet - Comprehensive metabolic panel - Lipid panel - TSH  3. Variants of migraine No migraines or need for the Zomig in the past 2 years. Associated with starting the Metoprolol and Amlodipine for BP control. Recheck CBC and TSH. - CBC with Differential/Platelet - TSH       Dortha Kern, PA  [facility]

## 2017-12-03 ENCOUNTER — Telehealth: Payer: Self-pay

## 2017-12-03 LAB — COMPREHENSIVE METABOLIC PANEL
ALBUMIN: 4.4 g/dL (ref 3.5–5.5)
ALK PHOS: 101 IU/L (ref 39–117)
ALT: 18 IU/L (ref 0–32)
AST: 15 IU/L (ref 0–40)
Albumin/Globulin Ratio: 1.7 (ref 1.2–2.2)
BILIRUBIN TOTAL: 0.2 mg/dL (ref 0.0–1.2)
BUN / CREAT RATIO: 17 (ref 9–23)
BUN: 13 mg/dL (ref 6–24)
CHLORIDE: 101 mmol/L (ref 96–106)
CO2: 26 mmol/L (ref 20–29)
Calcium: 10.2 mg/dL (ref 8.7–10.2)
Creatinine, Ser: 0.78 mg/dL (ref 0.57–1.00)
GFR calc Af Amer: 100 mL/min/{1.73_m2} (ref >59)
GFR calc non Af Amer: 86 mL/min/{1.73_m2} (ref >59)
GLUCOSE: 73 mg/dL (ref 65–99)
Globulin, Total: 2.6 g/dL (ref 1.5–4.5)
Potassium: 4.3 mmol/L (ref 3.5–5.2)
SODIUM: 140 mmol/L (ref 134–144)
Total Protein: 7 g/dL (ref 6.0–8.5)

## 2017-12-03 LAB — CBC WITH DIFFERENTIAL/PLATELET
BASOS ABS: 0 10*3/uL (ref 0.0–0.2)
Basos: 0 %
EOS (ABSOLUTE): 0.3 10*3/uL (ref 0.0–0.4)
Eos: 3 %
Hematocrit: 42.3 % (ref 34.0–46.6)
Hemoglobin: 14.8 g/dL (ref 11.1–15.9)
IMMATURE GRANS (ABS): 0 10*3/uL (ref 0.0–0.1)
Immature Granulocytes: 0 %
LYMPHS: 39 %
Lymphocytes Absolute: 3.6 10*3/uL — ABNORMAL HIGH (ref 0.7–3.1)
MCH: 32.9 pg (ref 26.6–33.0)
MCHC: 35 g/dL (ref 31.5–35.7)
MCV: 94 fL (ref 79–97)
MONOS ABS: 0.7 10*3/uL (ref 0.1–0.9)
Monocytes: 8 %
NEUTROS ABS: 4.7 10*3/uL (ref 1.4–7.0)
Neutrophils: 50 %
Platelets: 249 10*3/uL (ref 150–379)
RBC: 4.5 x10E6/uL (ref 3.77–5.28)
RDW: 13.2 % (ref 12.3–15.4)
WBC: 9.3 10*3/uL (ref 3.4–10.8)

## 2017-12-03 LAB — LIPID PANEL
CHOLESTEROL TOTAL: 203 mg/dL — AB (ref 100–199)
Chol/HDL Ratio: 4.1 ratio (ref 0.0–4.4)
HDL: 49 mg/dL (ref >39)
LDL Calculated: 94 mg/dL (ref 0–99)
TRIGLYCERIDES: 302 mg/dL — AB (ref 0–149)
VLDL CHOLESTEROL CAL: 60 mg/dL — AB (ref 5–40)

## 2017-12-03 LAB — TSH: TSH: 1.45 u[IU]/mL (ref 0.450–4.500)

## 2017-12-03 MED ORDER — FENOFIBRATE 145 MG PO TABS: 145.0000 mg | ORAL_TABLET | Freq: Every day | ORAL | 3 refills | Status: DC

## 2017-12-03 NOTE — Telephone Encounter (Signed)
Patient advised as below. Patient verbalizes understanding and is in agreement with treatment plan.  

## 2017-12-03 NOTE — Telephone Encounter (Signed)
lmtcb-Kerrilynn Derenzo V Eiman Maret, RMA  

## 2017-12-03 NOTE — Telephone Encounter (Signed)
-----   Message from Tamsen Roersennis E Chrismon, [place] sent at 12/03/2017 12:33 PM EST ----- Labs normal except triglycerides higher. Better cholesterol. Stop the Red Yeast Rice and try Fenofibrate 145 mg qd #30 & 3 RF. Recheck levels in 3 months.

## 2018-01-27 ENCOUNTER — Encounter: Payer: Self-pay | Admitting: Family Medicine

## 2018-01-27 ENCOUNTER — Ambulatory Visit: Payer: BLUE CROSS/BLUE SHIELD | Admitting: Family Medicine

## 2018-01-27 VITALS — BP 122/80 | HR 71 | Temp 98.7°F | Wt 124.4 lb

## 2018-01-27 DIAGNOSIS — R3915 Urgency of urination: Secondary | ICD-10-CM

## 2018-01-27 DIAGNOSIS — N3090 Cystitis, unspecified without hematuria: Secondary | ICD-10-CM

## 2018-01-27 LAB — POCT URINALYSIS DIPSTICK
Bilirubin, UA: NEGATIVE
GLUCOSE UA: NEGATIVE
Ketones, UA: NEGATIVE
LEUKOCYTES UA: NEGATIVE
Nitrite, UA: NEGATIVE
Protein, UA: NEGATIVE
Spec Grav, UA: 1.015 (ref 1.010–1.025)
Urobilinogen, UA: 0.2 E.U./dL
pH, UA: 6.5 (ref 5.0–8.0)

## 2018-01-27 MED ORDER — NITROFURANTOIN MONOHYD MACRO 100 MG PO CAPS: 100.0000 mg | ORAL_CAPSULE | Freq: Two times a day (BID) | ORAL | 0 refills | Status: DC

## 2018-01-27 NOTE — Progress Notes (Signed)
Patient: Leah Shaw Female    DOB: 1961-11-03   57 y.o.   MRN: [id number] Visit Date: 01/27/2018  Today's Provider: Dortha Kern, PA   Chief Complaint  Patient presents with  . Urinary Tract Infection   Subjective:    Female GU Problem  Primary symptoms comment: urine has a foul odor. This is a new problem. The current episode started today. The problem occurs intermittently. The problem has been unchanged. She is not pregnant. Associated symptoms include dysuria (burning sensation) and urgency. Associated symptoms comments: Decreased urination . She has tried nothing for the symptoms.   History reviewed. No pertinent past medical history. Patient Active Problem List   Diagnosis Date Noted  . Hyperlipidemia 11/30/2017  . 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 History of endocrine, metabolic or immunity disorder 40/98/1191  . BP (high blood pressure) 12/19/2015  . Low weight 12/19/2015  . Carpal tunnel syndrome 02/20/2010  . 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 ABDOMINAL HYSTERECTOMY  2009  . TUBAL LIGATION  1997   Family History  Problem Relation Age of Onset  . CAD Mother   . Heart disease Mother   . Heart attack Father   . Heart attack Maternal Grandmother   . Emphysema Paternal Grandmother    Allergies  Allergen Reactions  . Sulfa Antibiotics Swelling    swelling face and tongue    Current Outpatient Medications:  .  amLODipine (NORVASC) 10 MG tablet, Take 1 tablet (10 mg total) by mouth daily., Disp: 90 tablet, Rfl: 3 .  aspirin 81 MG tablet, Take by mouth., Disp: , Rfl:  .  cyproheptadine (PERIACTIN) 4 MG tablet, TAKE ONE TABLET BY MOUTH THREE TIMES DAILY AS NEEDED FOR ALLERGIES, Disp: 270 tablet, Rfl: 3 .  fenofibrate (TRICOR) 145 MG tablet, Take 1 tablet (145 mg total) by mouth daily., Disp: 30 tablet, Rfl: 3 .  metoprolol (LOPRESSOR) 50 MG tablet, Take 1  tablet (50 mg total) by mouth 2 (two) times daily., Disp: 180 tablet, Rfl: 3 .  Multiple Vitamin tablet, , Disp: , Rfl:  .  zolmitriptan (ZOMIG ZMT) 5 MG disintegrating tablet, Take by mouth., Disp: , Rfl:   Review of Systems  Constitutional: Negative.   Respiratory: Negative.   Cardiovascular: Negative.   Genitourinary: Positive for decreased urine volume, dysuria (burning sensation) and urgency.   Social History   Tobacco Use  . Smoking status: Current Every Day Smoker  . Smokeless tobacco: Never Used  Substance Use Topics  . Alcohol use: No    Alcohol/week: 0.0 oz   Objective:   BP 122/80 (BP Location: Right Arm, Patient Position: Sitting, Cuff Size: Normal)   Pulse 71   Temp 98.7 F (37.1 C) (Oral)   Wt 124 lb 6.4 oz (56.4 kg)   SpO2 97%   BMI 21.35 kg/m   Physical Exam  Constitutional: She is oriented to person, place, and time. She appears well-developed and well-nourished. No distress.  HENT:  Head: Normocephalic and atraumatic.  Right Ear: Hearing normal.  Left Ear: Hearing normal.  Nose: Nose normal.  Eyes: Conjunctivae and lids are normal. Right eye exhibits no discharge. Left eye exhibits no discharge. No scleral icterus.  Cardiovascular: Normal rate and regular rhythm.  Pulmonary/Chest: Effort normal and breath sounds normal. No respiratory distress.  Abdominal: Soft. Bowel sounds are normal. There is tenderness.  Suprapubic pressure discomfort to palpation. No  masses.  Musculoskeletal: Normal range of motion.  Neurological: She is alert and oriented to person, place, and time.  Skin: Skin is intact. No lesion and no rash noted.  Psychiatric: She has a normal mood and affect. Her speech is normal and behavior is normal. Thought content normal.      Assessment & Plan:     1. Urgency of urination Onset this morning without gross hematuria. Frequent urgency and burning with urination. Denies vaginal discharge or significant back pain. Bladder tenderness to  palpation. Treat for suspected cystitis and get C&S to identify any bacterial source. - POCT Urinalysis Dipstick  2. Cystitis Few WBC's on urine microscopic exam. Will get C&S and start the Macrobid with AZO-Standard. Increase fluid intake and recheck pending reports. - Urine Culture - nitrofurantoin, macrocrystal-monohydrate, (MACROBID) 100 MG capsule; Take 1 capsule (100 mg total) by mouth 2 (two) times daily.  Dispense: 14 capsule; Refill: 0       Dortha Kernennis Sadako Cegielski, PA  [facility]

## 2018-01-29 LAB — URINE CULTURE: Organism ID, Bacteria: NO GROWTH

## 2018-01-31 ENCOUNTER — Other Ambulatory Visit: Payer: Self-pay | Admitting: Family Medicine

## 2018-01-31 DIAGNOSIS — I10 Essential (primary) hypertension: Secondary | ICD-10-CM

## 2018-02-04 ENCOUNTER — Other Ambulatory Visit: Payer: Self-pay | Admitting: Family Medicine

## 2018-02-04 DIAGNOSIS — R636 Underweight: Secondary | ICD-10-CM

## 2018-02-07 NOTE — Telephone Encounter (Signed)
Maurine Ministerennis, I think this is your patient Thanks

## 2018-03-01 ENCOUNTER — Ambulatory Visit (INDEPENDENT_AMBULATORY_CARE_PROVIDER_SITE_OTHER): Payer: BLUE CROSS/BLUE SHIELD | Admitting: Family Medicine

## 2018-03-01 ENCOUNTER — Encounter: Payer: Self-pay | Admitting: Family Medicine

## 2018-03-01 VITALS — BP 118/82 | HR 90 | Temp 98.5°F | Wt 122.0 lb

## 2018-03-01 DIAGNOSIS — E782 Mixed hyperlipidemia: Secondary | ICD-10-CM

## 2018-03-01 DIAGNOSIS — I10 Essential (primary) hypertension: Secondary | ICD-10-CM | POA: Diagnosis not present

## 2018-03-01 NOTE — Progress Notes (Signed)
Patient: Leah CuretDeborah L Cubit Female    DOB: 12/09/1960   57 y.o.   MRN: [id number] Visit Date: 03/01/2018  Today's Provider: Dortha Kernennis Sharissa Brierley, PA   Chief Complaint  Patient presents with  . Hyperlipidemia  . Follow-up   Subjective:    HPI  Lipid/Cholesterol, Follow-up:   Last seen for this 3 months ago.  Management changes since that visit include discontinue Red Yeast Rice and start Fenofibrate 145 mg daily. Elevated Triglycerides. Recheck in 3 months . Last Lipid Panel:    Component Value Date/Time   CHOL 203 (H) 12/02/2017 1220   TRIG 302 (H) 12/02/2017 1220   HDL 49 12/02/2017 1220   CHOLHDL 4.1 12/02/2017 1220   LDLCALC 94 12/02/2017 1220    Risk factors for vascular disease include hypertension and smoking  She reports good compliance with treatment. She is not having side effects.  Current symptoms include none Weight trend: stable Prior visit with dietician: no Current diet: no fried foods Current exercise: none  Wt Readings from Last 3 Encounters:  03/01/18 122 lb (55.3 kg)  01/27/18 124 lb 6.4 oz (56.4 kg)  12/02/17 125 lb 6.4 oz (56.9 kg)    ------------------------------------------------------------------- History reviewed. No pertinent past medical history. Patient Active Problem List   Diagnosis Date Noted  . Hyperlipidemia 11/30/2017  . 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 History of endocrine, metabolic or immunity disorder 40/98/119101/26/2017  . BP (high blood pressure) 12/19/2015  . Low weight 12/19/2015  . Carpal tunnel syndrome 02/20/2010  . 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 ABDOMINAL HYSTERECTOMY  2009  . TUBAL LIGATION  1997   Family History  Problem Relation Age of Onset  . CAD Mother   . Heart disease Mother   . Heart attack Father   . Heart attack Maternal Grandmother   . Emphysema Paternal Grandmother    Allergies  Allergen Reactions  .  Sulfa Antibiotics Swelling    swelling face and tongue    Current Outpatient Medications:  .  amLODipine (NORVASC) 10 MG tablet, TAKE 1 TABLET BY MOUTH DAILY, Disp: 90 tablet, Rfl: 3 .  aspirin 81 MG tablet, Take by mouth., Disp: , Rfl:  .  cyproheptadine (PERIACTIN) 4 MG tablet, TAKE 1 TABLET BY MOUTH THREE TIMES DAILY AS NEEDED FOR ALLERGIES, Disp: 270 tablet, Rfl: 0 .  fenofibrate (TRICOR) 145 MG tablet, Take 1 tablet (145 mg total) by mouth daily., Disp: 30 tablet, Rfl: 3 .  metoprolol tartrate (LOPRESSOR) 50 MG tablet, TAKE 1 TABLET BY MOUTH TWICE DAILY, Disp: 180 tablet, Rfl: 3 .  Multiple Vitamin tablet, , Disp: , Rfl:  .  zolmitriptan (ZOMIG ZMT) 5 MG disintegrating tablet, Take by mouth., Disp: , Rfl:   Review of Systems  Constitutional: Negative.   Respiratory: Negative.   Cardiovascular: Negative.   Musculoskeletal: Negative.    Social History   Tobacco Use  . Smoking status: Current Every Day Smoker  . Smokeless tobacco: Never Used  Substance Use Topics  . Alcohol use: No    Alcohol/week: 0.0 oz   Objective:   BP 118/82 (BP Location: Right Arm, Patient Position: Sitting, Cuff Size: Normal)   Pulse 90   Temp 98.5 F (36.9 C) (Oral)   Wt 122 lb (55.3 kg)   SpO2 99%   BMI 20.94 kg/m   Physical Exam  Constitutional: She is oriented to person, place, and time. She appears  well-developed and well-nourished. No distress.  HENT:  Head: Normocephalic and atraumatic.  Right Ear: Hearing normal.  Left Ear: Hearing normal.  Nose: Nose normal.  Eyes: Conjunctivae and lids are normal. Right eye exhibits no discharge. Left eye exhibits no discharge. No scleral icterus.  Cardiovascular: Normal rate.  Pulmonary/Chest: Effort normal. No respiratory distress.  Musculoskeletal: Normal range of motion.  Neurological: She is alert and oriented to person, place, and time.  Skin: Skin is intact. No lesion and no rash noted.  Psychiatric: She has a normal mood and affect. Her  speech is normal and behavior is normal. Thought content normal.      Assessment & Plan:     1. Mixed hyperlipidemia Tolerating Tricor without GI upset or myalgias. Trying to follow a low fat diet. Extra stress recently with father having to be hospitalized 3 times in the past 4-5 weeks for COPD, A.fib, pneumonia and "blood infection". Recheck labs and continue Tricor 145 mg qd. Follow up pending lab reports. - CBC with Differential/Platelet - Comprehensive metabolic panel - Lipid panel  2. Essential hypertension Well controlled and stable BP on the Amlodipine 10 mg qd. Continue to work on smoking cessation and restricting salt in diet. Recheck prn. - CBC with Differential/Platelet - Comprehensive metabolic panel - Lipid panel       Dortha Kern, PA  [facility]

## 2018-03-02 LAB — CBC WITH DIFFERENTIAL/PLATELET
BASOS ABS: 0 10*3/uL (ref 0.0–0.2)
Basos: 0 %
EOS (ABSOLUTE): 0.1 10*3/uL (ref 0.0–0.4)
EOS: 1 %
HEMATOCRIT: 42.2 % (ref 34.0–46.6)
HEMOGLOBIN: 14.7 g/dL (ref 11.1–15.9)
IMMATURE GRANS (ABS): 0 10*3/uL (ref 0.0–0.1)
Immature Granulocytes: 0 %
LYMPHS ABS: 2.4 10*3/uL (ref 0.7–3.1)
Lymphs: 24 %
MCH: 31.8 pg (ref 26.6–33.0)
MCHC: 34.8 g/dL (ref 31.5–35.7)
MCV: 91 fL (ref 79–97)
MONOCYTES: 8 %
Monocytes Absolute: 0.7 10*3/uL (ref 0.1–0.9)
Neutrophils Absolute: 6.6 10*3/uL (ref 1.4–7.0)
Neutrophils: 67 %
Platelets: 279 10*3/uL (ref 150–379)
RBC: 4.62 x10E6/uL (ref 3.77–5.28)
RDW: 13.3 % (ref 12.3–15.4)
WBC: 9.9 10*3/uL (ref 3.4–10.8)

## 2018-03-02 LAB — LIPID PANEL
CHOLESTEROL TOTAL: 166 mg/dL (ref 100–199)
Chol/HDL Ratio: 2.7 ratio (ref 0.0–4.4)
HDL: 62 mg/dL (ref >39)
LDL CALC: 83 mg/dL (ref 0–99)
Triglycerides: 103 mg/dL (ref 0–149)
VLDL CHOLESTEROL CAL: 21 mg/dL (ref 5–40)

## 2018-03-02 LAB — COMPREHENSIVE METABOLIC PANEL
ALBUMIN: 4.8 g/dL (ref 3.5–5.5)
ALK PHOS: 80 IU/L (ref 39–117)
ALT: 20 IU/L (ref 0–32)
AST: 29 IU/L (ref 0–40)
Albumin/Globulin Ratio: 1.8 (ref 1.2–2.2)
BILIRUBIN TOTAL: 0.3 mg/dL (ref 0.0–1.2)
BUN / CREAT RATIO: 13 (ref 9–23)
BUN: 15 mg/dL (ref 6–24)
CHLORIDE: 102 mmol/L (ref 96–106)
CO2: 21 mmol/L (ref 20–29)
Calcium: 10.7 mg/dL — ABNORMAL HIGH (ref 8.7–10.2)
Creatinine, Ser: 1.18 mg/dL — ABNORMAL HIGH (ref 0.57–1.00)
GFR calc non Af Amer: 52 mL/min/{1.73_m2} — ABNORMAL LOW (ref >59)
GFR, EST AFRICAN AMERICAN: 60 mL/min/{1.73_m2} (ref >59)
GLOBULIN, TOTAL: 2.6 g/dL (ref 1.5–4.5)
Glucose: 83 mg/dL (ref 65–99)
Potassium: 4.2 mmol/L (ref 3.5–5.2)
SODIUM: 139 mmol/L (ref 134–144)
TOTAL PROTEIN: 7.4 g/dL (ref 6.0–8.5)

## 2018-03-04 ENCOUNTER — Telehealth: Payer: Self-pay

## 2018-03-04 NOTE — Telephone Encounter (Signed)
Patient advised. 3 month follow up scheduled.  

## 2018-03-04 NOTE — Telephone Encounter (Signed)
LMTCB

## 2018-03-04 NOTE — Telephone Encounter (Signed)
-----   Message from Tamsen Roersennis E Chrismon, [place] sent at 03/03/2018  5:30 PM EDT ----- Greatly improved cholesterol and triglycerides. The Tricor seems to be working well. Slight kidney strain with increase in creatinine. Need to drink extra fluids and recheck progress in 3 months.

## 2018-03-24 ENCOUNTER — Other Ambulatory Visit: Payer: Self-pay | Admitting: Family Medicine

## 2018-04-30 ENCOUNTER — Other Ambulatory Visit: Payer: Self-pay | Admitting: Family Medicine

## 2018-04-30 DIAGNOSIS — R636 Underweight: Secondary | ICD-10-CM

## 2018-06-06 ENCOUNTER — Encounter: Payer: Self-pay | Admitting: Family Medicine

## 2018-06-06 ENCOUNTER — Ambulatory Visit: Payer: BLUE CROSS/BLUE SHIELD | Admitting: Family Medicine

## 2018-06-06 VITALS — BP 112/68 | HR 72 | Temp 97.9°F | Resp 16 | Wt 119.0 lb

## 2018-06-06 DIAGNOSIS — R636 Underweight: Secondary | ICD-10-CM

## 2018-06-06 DIAGNOSIS — F17200 Nicotine dependence, unspecified, uncomplicated: Secondary | ICD-10-CM

## 2018-06-06 DIAGNOSIS — E782 Mixed hyperlipidemia: Secondary | ICD-10-CM

## 2018-06-06 NOTE — Progress Notes (Signed)
Patient: Leah Shaw Female    DOB: 10-Sep-1961   57 y.o.   MRN: [id number] Visit Date: 06/06/2018  Today's Provider: Dortha Kern, PA   Chief Complaint  Patient presents with  . Hypertension  . Hyperlipidemia   Subjective:    Hypertension  This is a chronic problem. The problem is unchanged. The problem is controlled. Pertinent negatives include no anxiety, blurred vision, chest pain, headaches, malaise/fatigue, neck pain, orthopnea, palpitations, peripheral edema, PND, shortness of breath or sweats. There are no associated agents to hypertension. There are no compliance problems.   Hyperlipidemia  This is a chronic problem. The problem is controlled. Recent lipid tests were reviewed and are normal. Pertinent negatives include no chest pain or shortness of breath. Compliance problems include medication side effects (Pt reports stopping fenofibrate secondary to side effects. ).    Lab Results  Component Value Date   CHOL 166 03/01/2018   CHOL 203 (H) 12/02/2017   CHOL 215 (H) 08/26/2017   Lab Results  Component Value Date   HDL 62 03/01/2018   HDL 49 12/02/2017   HDL 51 08/26/2017   Lab Results  Component Value Date   LDLCALC 83 03/01/2018   LDLCALC 94 12/02/2017   LDLCALC 116 (H) 08/26/2017   Lab Results  Component Value Date   TRIG 103 03/01/2018   TRIG 302 (H) 12/02/2017   TRIG 240 (H) 08/26/2017   Lab Results  Component Value Date   CHOLHDL 2.7 03/01/2018   CHOLHDL 4.1 12/02/2017   CHOLHDL 3.4 01/28/2017   No results found for: LDLDIRECT BP Readings from Last 3 Encounters:  06/06/18 112/68  03/01/18 118/82  01/27/18 122/80   No past medical history on file. Past Surgical History:  Procedure Laterality Date  . ABDOMINAL HYSTERECTOMY  2009  . TUBAL LIGATION  1997   Patient Active Problem List   Diagnosis Date Noted  . Hyperlipidemia 11/30/2017  . 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 History of endocrine, metabolic or immunity disorder 32/44/0102    . BP (high blood pressure) 12/19/2015  . Low weight 12/19/2015  . Carpal tunnel syndrome 02/20/2010  . 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 CAD Mother   . Heart disease Mother   . Heart attack Father   . Heart attack Maternal Grandmother   . Emphysema Paternal Grandmother      Allergies  Allergen Reactions  . Sulfa Antibiotics Swelling    swelling face and tongue    Current Outpatient Medications:  .  amLODipine (NORVASC) 10 MG tablet, TAKE 1 TABLET BY MOUTH DAILY, Disp: 90 tablet, Rfl: 3 .  aspirin 81 MG tablet, Take by mouth., Disp: , Rfl:  .  cyproheptadine (PERIACTIN) 4 MG tablet, TAKE 1 TABLET BY MOUTH THREE TIMES DAILY AS NEEDED FOR ALLERGIES, Disp: 270 tablet, Rfl: 0 .  metoprolol tartrate (LOPRESSOR) 50 MG tablet, TAKE 1 TABLET BY MOUTH TWICE DAILY, Disp: 180 tablet, Rfl: 3 .  Multiple Vitamin tablet, , Disp: , Rfl:  .  zolmitriptan (ZOMIG ZMT) 5 MG disintegrating tablet, Take by mouth., Disp: , Rfl:  .  fenofibrate (TRICOR) 145 MG tablet, TAKE 1 TABLET(145 MG) BY MOUTH DAILY (Patient not taking: Reported on 06/06/2018), Disp: 30 tablet, Rfl: 6  Review of Systems  Constitutional: Negative.  Negative for malaise/fatigue.  Eyes: Negative for blurred vision.  Respiratory: Negative.  Negative for shortness of breath.  Cardiovascular: Positive for leg swelling. Negative for chest pain, palpitations, orthopnea and PND.  Gastrointestinal: Negative.   Musculoskeletal: Negative for neck pain.  Neurological: Negative for dizziness, light-headedness and headaches.   Social History   Tobacco Use  . Smoking status: Current Every Day Smoker  . Smokeless tobacco: Never Used  Substance Use Topics  . Alcohol use: No    Alcohol/week: 0.0 oz   Objective:   BP 112/68 (BP Location: Right Arm, Patient Position: Sitting, Cuff Size: Normal)   Pulse 72   Temp 97.9 F (36.6 C) (Oral)    Resp 16   Wt 119 lb (54 kg)   BMI 20.43 kg/m  Vitals:   06/06/18 0818  BP: 112/68  Pulse: 72  Resp: 16  Temp: 97.9 F (36.6 C)  TempSrc: Oral  Weight: 119 lb (54 kg)   Wt Readings from Last 3 Encounters:  06/06/18 119 lb (54 kg)  03/01/18 122 lb (55.3 kg)  01/27/18 124 lb 6.4 oz (56.4 kg)   Physical Exam  Constitutional: She is oriented to person, place, and time. She appears well-developed and well-nourished. No distress.  HENT:  Head: Normocephalic and atraumatic.  Right Ear: Hearing normal.  Left Ear: Hearing normal.  Nose: Nose normal.  Eyes: Conjunctivae and lids are normal. Right eye exhibits no discharge. Left eye exhibits no discharge. No scleral icterus.  Neck: Neck supple. No thyromegaly present.  Cardiovascular: Normal rate and regular rhythm.  Pulmonary/Chest: Effort normal and breath sounds normal. No respiratory distress.  Abdominal: Soft. Bowel sounds are normal.  Musculoskeletal: Normal range of motion.  Neurological: She is alert and oriented to person, place, and time.  Skin: Skin is intact. No lesion and no rash noted.  Psychiatric: She has a normal mood and affect. Her speech is normal. Thought content normal. She is withdrawn.  Sadness and decreased appetite with the beginning of her divorce.      Assessment & Plan:     1. Mixed hyperlipidemia Weight down a little with BMI around 20. Had some pain in the bottom of feet and it lasted for a few weeks until she stopped taking the Tricor. Unsure if it was a side effect or related to stress and physical activity. Will recheck CBC, CMP and Lipid Panel. Discontinue Tricor/fenofibrates. - CBC with Differential/Platelet - Comprehensive metabolic panel - Lipid panel  2. Compulsive tobacco user syndrome Increased smoking a little due to divorce stress. Encouraged to taper back. May need Welbutrin if unable to curtail use.  3. Low weight Still taking Periactin to try to stimulate appetite. Recent start of  divorce has decreased eating due to stress. Will recheck labs and continue Periactin. - CBC with Differential/Platelet - Comprehensive metabolic panel       Dortha Kernennis Samyra Limb, PA  [facility]

## 2018-06-07 LAB — COMPREHENSIVE METABOLIC PANEL
ALBUMIN: 4.4 g/dL (ref 3.5–5.5)
ALT: 14 IU/L (ref 0–32)
AST: 21 IU/L (ref 0–40)
Albumin/Globulin Ratio: 1.8 (ref 1.2–2.2)
Alkaline Phosphatase: 97 IU/L (ref 39–117)
BUN / CREAT RATIO: 13 (ref 9–23)
BUN: 15 mg/dL (ref 6–24)
Bilirubin Total: 0.4 mg/dL (ref 0.0–1.2)
CALCIUM: 10.4 mg/dL — AB (ref 8.7–10.2)
CO2: 21 mmol/L (ref 20–29)
CREATININE: 1.12 mg/dL — AB (ref 0.57–1.00)
Chloride: 102 mmol/L (ref 96–106)
GFR calc Af Amer: 64 mL/min/{1.73_m2} (ref >59)
GFR, EST NON AFRICAN AMERICAN: 56 mL/min/{1.73_m2} — AB (ref >59)
GLUCOSE: 81 mg/dL (ref 65–99)
Globulin, Total: 2.5 g/dL (ref 1.5–4.5)
Potassium: 4.2 mmol/L (ref 3.5–5.2)
Sodium: 138 mmol/L (ref 134–144)
TOTAL PROTEIN: 6.9 g/dL (ref 6.0–8.5)

## 2018-06-07 LAB — CBC WITH DIFFERENTIAL/PLATELET
BASOS ABS: 0 10*3/uL (ref 0.0–0.2)
Basos: 0 %
EOS (ABSOLUTE): 0.1 10*3/uL (ref 0.0–0.4)
Eos: 2 %
HEMOGLOBIN: 14.9 g/dL (ref 11.1–15.9)
Hematocrit: 43.2 % (ref 34.0–46.6)
IMMATURE GRANS (ABS): 0 10*3/uL (ref 0.0–0.1)
Immature Granulocytes: 0 %
LYMPHS: 30 %
Lymphocytes Absolute: 2.7 10*3/uL (ref 0.7–3.1)
MCH: 32 pg (ref 26.6–33.0)
MCHC: 34.5 g/dL (ref 31.5–35.7)
MCV: 93 fL (ref 79–97)
MONOCYTES: 8 %
Monocytes Absolute: 0.7 10*3/uL (ref 0.1–0.9)
NEUTROS ABS: 5.3 10*3/uL (ref 1.4–7.0)
Neutrophils: 60 %
Platelets: 262 10*3/uL (ref 150–450)
RBC: 4.65 x10E6/uL (ref 3.77–5.28)
RDW: 13.5 % (ref 12.3–15.4)
WBC: 8.9 10*3/uL (ref 3.4–10.8)

## 2018-06-07 LAB — LIPID PANEL
Chol/HDL Ratio: 3.6 ratio (ref 0.0–4.4)
Cholesterol, Total: 179 mg/dL (ref 100–199)
HDL: 50 mg/dL (ref >39)
LDL CALC: 112 mg/dL — AB (ref 0–99)
Triglycerides: 87 mg/dL (ref 0–149)
VLDL CHOLESTEROL CAL: 17 mg/dL (ref 5–40)

## 2018-06-09 ENCOUNTER — Telehealth: Payer: Self-pay | Admitting: Family Medicine

## 2018-06-09 NOTE — Telephone Encounter (Signed)
Pt is requesting her lab results from 06/06/18. Maurine MinisterDennis is out of the office this week and next. Can another provider review the results? Please advise. Thanks TNP

## 2018-06-10 NOTE — Telephone Encounter (Signed)
Her kidney function improved just slightly with recommendations of increasing fluids. It is only borderline high at this time. I would guess Leah Shaw would just want to monitor her renal function every 6 months or so to see if this is new baseline or if it continues to progress. Cholesterol seems stable with slight increase in her LDL again. Blood count is normal.

## 2018-06-10 NOTE — Telephone Encounter (Signed)
Patient advised. She will schedule a follow up appointment in 4-6 months to recheck labs.

## 2018-07-29 ENCOUNTER — Other Ambulatory Visit: Payer: Self-pay | Admitting: Family Medicine

## 2018-07-29 DIAGNOSIS — R636 Underweight: Secondary | ICD-10-CM

## 2018-10-25 ENCOUNTER — Ambulatory Visit (INDEPENDENT_AMBULATORY_CARE_PROVIDER_SITE_OTHER): Payer: BLUE CROSS/BLUE SHIELD

## 2018-10-25 ENCOUNTER — Other Ambulatory Visit: Payer: Self-pay | Admitting: Family Medicine

## 2018-10-25 DIAGNOSIS — Z23 Encounter for immunization: Secondary | ICD-10-CM

## 2018-10-25 DIAGNOSIS — Z1231 Encounter for screening mammogram for malignant neoplasm of breast: Secondary | ICD-10-CM

## 2018-10-27 ENCOUNTER — Other Ambulatory Visit: Payer: Self-pay | Admitting: Family Medicine

## 2018-10-27 DIAGNOSIS — R636 Underweight: Secondary | ICD-10-CM

## 2018-10-27 MED ORDER — CYPROHEPTADINE HCL 4 MG PO TABS: ORAL_TABLET | ORAL | 0 refills | Status: DC

## 2018-10-27 NOTE — Telephone Encounter (Signed)
[facility] faxed refill request for the following medications:  cyproheptadine (PERIACTIN) 4 MG tablet   90 day supply  Please advise. Thanks TNP

## 2018-10-27 NOTE — Telephone Encounter (Signed)
Please review. Thanks!  

## 2018-11-02 ENCOUNTER — Ambulatory Visit
Admission: RE | Admit: 2018-11-02 | Discharge: 2018-11-02 | Disposition: A | Discharge status: Home / Self Care | Payer: BLUE CROSS/BLUE SHIELD | Source: Ambulatory Visit | Attending: Family Medicine | Admitting: Family Medicine

## 2018-11-02 DIAGNOSIS — Z1231 Encounter for screening mammogram for malignant neoplasm of breast: Secondary | ICD-10-CM

## 2018-11-03 ENCOUNTER — Other Ambulatory Visit: Payer: Self-pay | Admitting: Family Medicine

## 2018-11-03 ENCOUNTER — Telehealth: Payer: Self-pay | Admitting: *Deleted

## 2018-11-03 DIAGNOSIS — N6489 Other specified disorders of breast: Secondary | ICD-10-CM

## 2018-11-03 DIAGNOSIS — R928 Other abnormal and inconclusive findings on diagnostic imaging of breast: Secondary | ICD-10-CM

## 2018-11-03 NOTE — Telephone Encounter (Signed)
Pt advised.   Thanks,   -Season Astacio  

## 2018-11-03 NOTE — Telephone Encounter (Signed)
LMOVM for pt to return call 

## 2018-11-03 NOTE — Telephone Encounter (Signed)
-----   Message from Tamsen Roersennis E Chrismon, [place] sent at 11/03/2018 12:29 PM EST ----- Radiologist identified a suspicious asymmetry in the right breast and recommend further diagnostic mammogram with possible ultrasound. X-ray will contact this patient for further imaging.

## 2018-11-10 ENCOUNTER — Ambulatory Visit
Admission: RE | Admit: 2018-11-10 | Discharge: 2018-11-10 | Disposition: A | Discharge status: Home / Self Care | Payer: BLUE CROSS/BLUE SHIELD | Source: Ambulatory Visit | Attending: Family Medicine | Admitting: Family Medicine

## 2018-11-10 DIAGNOSIS — N6489 Other specified disorders of breast: Secondary | ICD-10-CM

## 2018-11-10 DIAGNOSIS — R922 Inconclusive mammogram: Secondary | ICD-10-CM | POA: Diagnosis not present

## 2018-11-10 DIAGNOSIS — R928 Other abnormal and inconclusive findings on diagnostic imaging of breast: Secondary | ICD-10-CM | POA: Diagnosis not present

## 2019-01-25 ENCOUNTER — Other Ambulatory Visit: Payer: Self-pay | Admitting: Family Medicine

## 2019-01-25 DIAGNOSIS — R636 Underweight: Secondary | ICD-10-CM

## 2019-01-25 DIAGNOSIS — I10 Essential (primary) hypertension: Secondary | ICD-10-CM

## 2019-01-26 NOTE — Telephone Encounter (Signed)
Pharmacy requesting refills. Thanks!  

## 2019-03-03 ENCOUNTER — Encounter: Payer: Self-pay | Admitting: Family Medicine

## 2019-03-03 ENCOUNTER — Other Ambulatory Visit: Payer: Self-pay

## 2019-03-03 ENCOUNTER — Ambulatory Visit (INDEPENDENT_AMBULATORY_CARE_PROVIDER_SITE_OTHER): Payer: BLUE CROSS/BLUE SHIELD | Admitting: Family Medicine

## 2019-03-03 VITALS — BP 122/84 | HR 72 | Temp 98.2°F | Resp 16 | Wt 121.0 lb

## 2019-03-03 DIAGNOSIS — J0120 Acute ethmoidal sinusitis, unspecified: Secondary | ICD-10-CM | POA: Diagnosis not present

## 2019-03-03 DIAGNOSIS — I10 Essential (primary) hypertension: Secondary | ICD-10-CM

## 2019-03-03 DIAGNOSIS — F17200 Nicotine dependence, unspecified, uncomplicated: Secondary | ICD-10-CM | POA: Diagnosis not present

## 2019-03-03 MED ORDER — AMOXICILLIN-POT CLAVULANATE 875-125 MG PO TABS: 1.0000 | ORAL_TABLET | Freq: Two times a day (BID) | ORAL | 0 refills | Status: DC

## 2019-03-03 NOTE — Progress Notes (Signed)
Patient: Leah Shaw Female    DOB: October 15, 1961   58 y.o.   MRN: [id number] Visit Date: 03/03/2019  Today's Provider: Megan Mans, MD   Chief Complaint  Patient presents with  . Sinusitis   Subjective:     Sinusitis  This is a new problem. The current episode started in the past 7 days. The problem has been rapidly worsening since onset. There has been no fever. Associated symptoms include congestion, coughing, diaphoresis, ear pain (Right ear pain), headaches, sinus pressure and a sore throat. Pertinent negatives include no chills, shortness of breath or swollen glands. Past treatments include nothing. The treatment provided no relief.    Allergies  Allergen Reactions  . Sulfa Antibiotics Swelling    swelling face and tongue  . Fenofibrate Other (See Comments)    Foot pains     Current Outpatient Medications:  .  amLODipine (NORVASC) 10 MG tablet, TAKE 1 TABLET BY MOUTH DAILY, Disp: 90 tablet, Rfl: 0 .  aspirin 81 MG tablet, Take by mouth., Disp: , Rfl:  .  cyproheptadine (PERIACTIN) 4 MG tablet, TAKE 1 TABLET BY MOUTH THREE TIMES DAILY AS NEEDED FOR ALLERGIES, Disp: 270 tablet, Rfl: 0 .  metoprolol tartrate (LOPRESSOR) 50 MG tablet, TAKE 1 TABLET BY MOUTH TWICE DAILY, Disp: 180 tablet, Rfl: 0 .  Multiple Vitamin tablet, , Disp: , Rfl:  .  zolmitriptan (ZOMIG ZMT) 5 MG disintegrating tablet, Take by mouth., Disp: , Rfl:   Review of Systems  Constitutional: Positive for diaphoresis and fatigue. Negative for activity change, appetite change, chills, fever and unexpected weight change.  HENT: Positive for congestion, ear pain (Right ear pain), postnasal drip, sinus pressure, sinus pain and sore throat. Negative for ear discharge, nosebleeds, rhinorrhea, tinnitus and trouble swallowing.   Eyes: Negative.   Respiratory: Positive for cough. Negative for apnea, choking, chest tightness, shortness of breath, wheezing and stridor.   Cardiovascular: Negative.    Gastrointestinal: Negative.   Allergic/Immunologic: Negative.   Neurological: Positive for headaches. Negative for dizziness and light-headedness.  Psychiatric/Behavioral: Negative.     Social History   Tobacco Use  . Smoking status: Current Every Day Smoker  . Smokeless tobacco: Never Used  Substance Use Topics  . Alcohol use: No    Alcohol/week: 0.0 standard drinks      Objective:   BP 122/84 (BP Location: Right Arm, Patient Position: Sitting, Cuff Size: Normal)   Pulse 72   Temp 98.2 F (36.8 C) (Oral)   Resp 16   Wt 121 lb (54.9 kg)   BMI 20.77 kg/m  Vitals:   03/03/19 1018  BP: 122/84  Pulse: 72  Resp: 16  Temp: 98.2 F (36.8 C)  TempSrc: Oral  Weight: 121 lb (54.9 kg)     Physical Exam Vitals signs reviewed.  Constitutional:      Appearance: Normal appearance.  HENT:     Head: Normocephalic and atraumatic.     Comments: Tender over ethmoid sinuses.    Right Ear: External ear normal.     Left Ear: Tympanic membrane and external ear normal.     Nose: Nose normal.     Mouth/Throat:     Pharynx: Oropharynx is clear.  Eyes:     General: No scleral icterus.    Conjunctiva/sclera: Conjunctivae normal.  Neurological:     Mental Status: She is alert.         Assessment & Plan    1. Subacute ethmoidal sinusitis Rx  with Augmentin (sulfa allergic),fluids,tylenol,Netti pot sinus rinses.RTC prn  2. Essential hypertension   3. Compulsive tobacco user syndrome      Megan Mansichard Tascha Casares Jr, MD  [facility]

## 2019-03-08 ENCOUNTER — Telehealth: Payer: Self-pay | Admitting: Family Medicine

## 2019-03-08 MED ORDER — DOXYCYCLINE HYCLATE 100 MG PO TABS: 100.0000 mg | ORAL_TABLET | Freq: Two times a day (BID) | ORAL | 0 refills | Status: DC

## 2019-03-08 NOTE — Telephone Encounter (Signed)
Please advise 

## 2019-03-08 NOTE — Telephone Encounter (Signed)
Patient called again stating she is feeling worse and previous message was send to [place] but he out of the office today. Please advise.

## 2019-03-08 NOTE — Telephone Encounter (Signed)
Medication was sent in   

## 2019-03-08 NOTE — Telephone Encounter (Signed)
Pt was in on the 10th and seen Dr. Sullivan Lone.  He gave her an antibiotic for sinus pressure. She says she is not feeling any better.   She says she is having a really bad sore throat and a lot of pressure in her face. She does not know if she has a fever.  Some cough.  CB#  [phone]  Thanks Barth Kirks

## 2019-03-08 NOTE — Telephone Encounter (Signed)
OK to call in Doxycycline for 10 days. May need to see Maurine Minister if not improving.

## 2019-03-09 NOTE — Telephone Encounter (Signed)
Thanks! Schedule patient for doxy.me visit or in office visit (if no fever or significant dyspnea) Monday.

## 2019-04-25 ENCOUNTER — Other Ambulatory Visit: Payer: Self-pay | Admitting: Family Medicine

## 2019-04-25 DIAGNOSIS — R636 Underweight: Secondary | ICD-10-CM

## 2019-04-25 DIAGNOSIS — I10 Essential (primary) hypertension: Secondary | ICD-10-CM

## 2019-07-19 IMAGING — MG DIGITAL DIAGNOSTIC UNILATERAL RIGHT MAMMOGRAM WITH TOMO AND CAD
4 series · 4 of 12 positions shown · non-contrast
Comparison: 11/02/2018

CLINICAL DATA: The patient returns after baseline screening study
for evaluation of a possible RIGHT breast asymmetry. The patient's
sister has been recently diagnosed with breast cancer at age 57.

EXAM:
DIGITAL DIAGNOSTIC RIGHT MAMMOGRAM WITH CAD AND TOMO
ULTRASOUND RIGHT BREAST

[R MLO synth-2D]
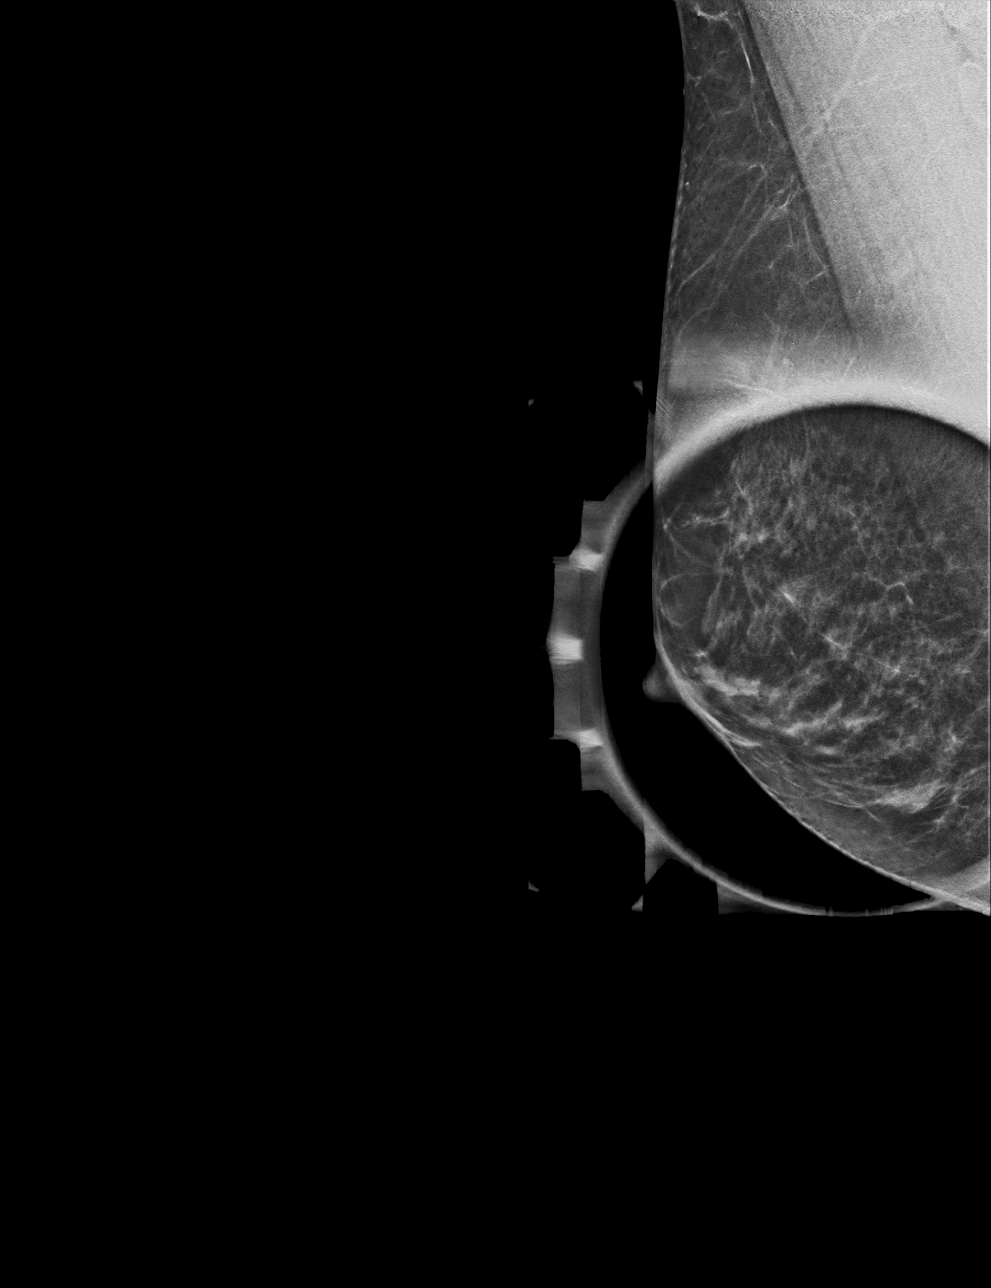

[R ML synth-2D]
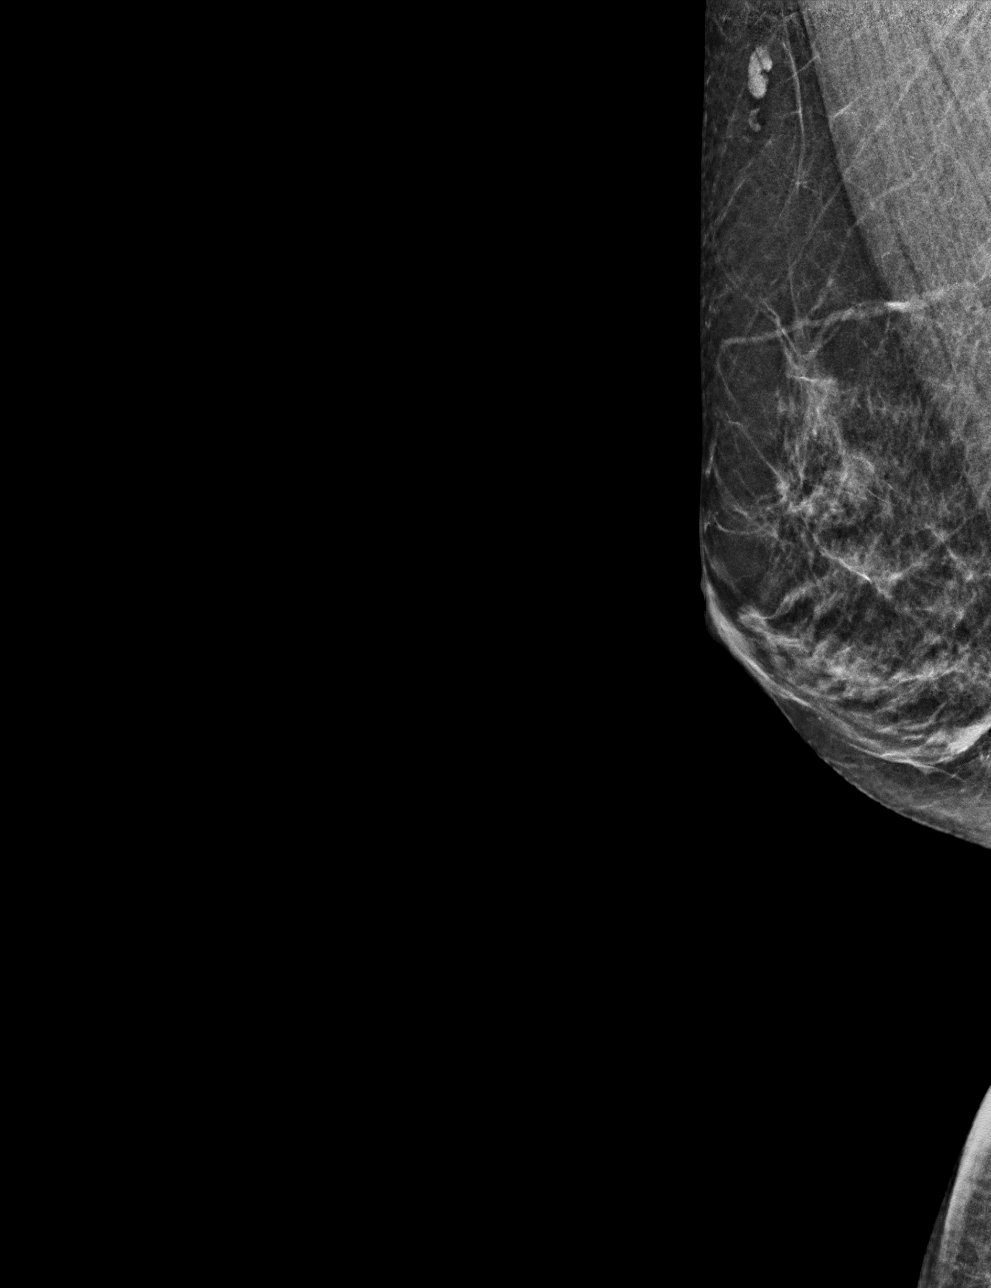

[R MLO tomo · tomo slice 21/42.0]
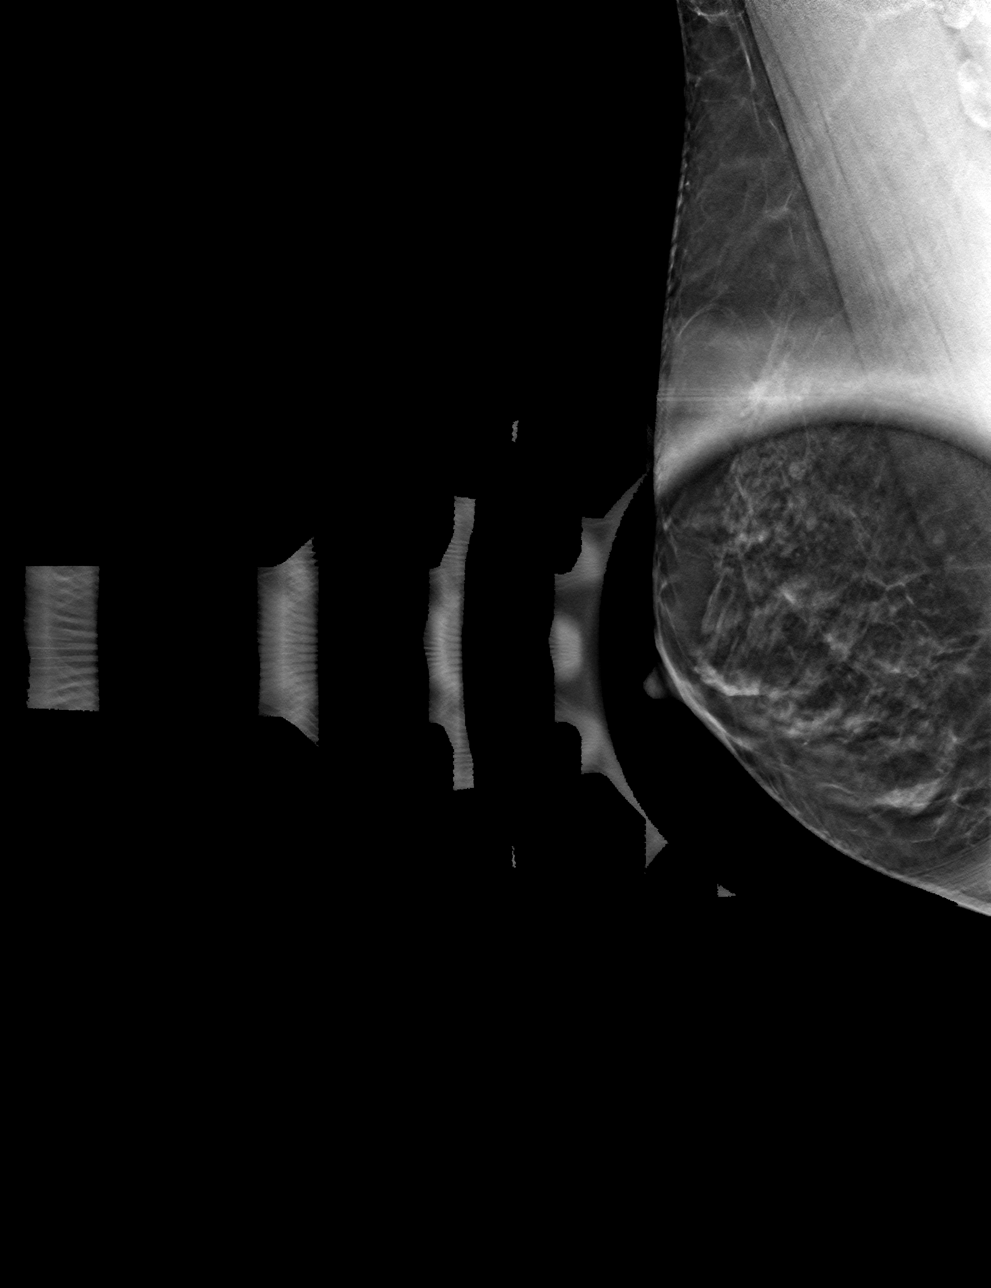

[R ML tomo · tomo slice 21/41.0]
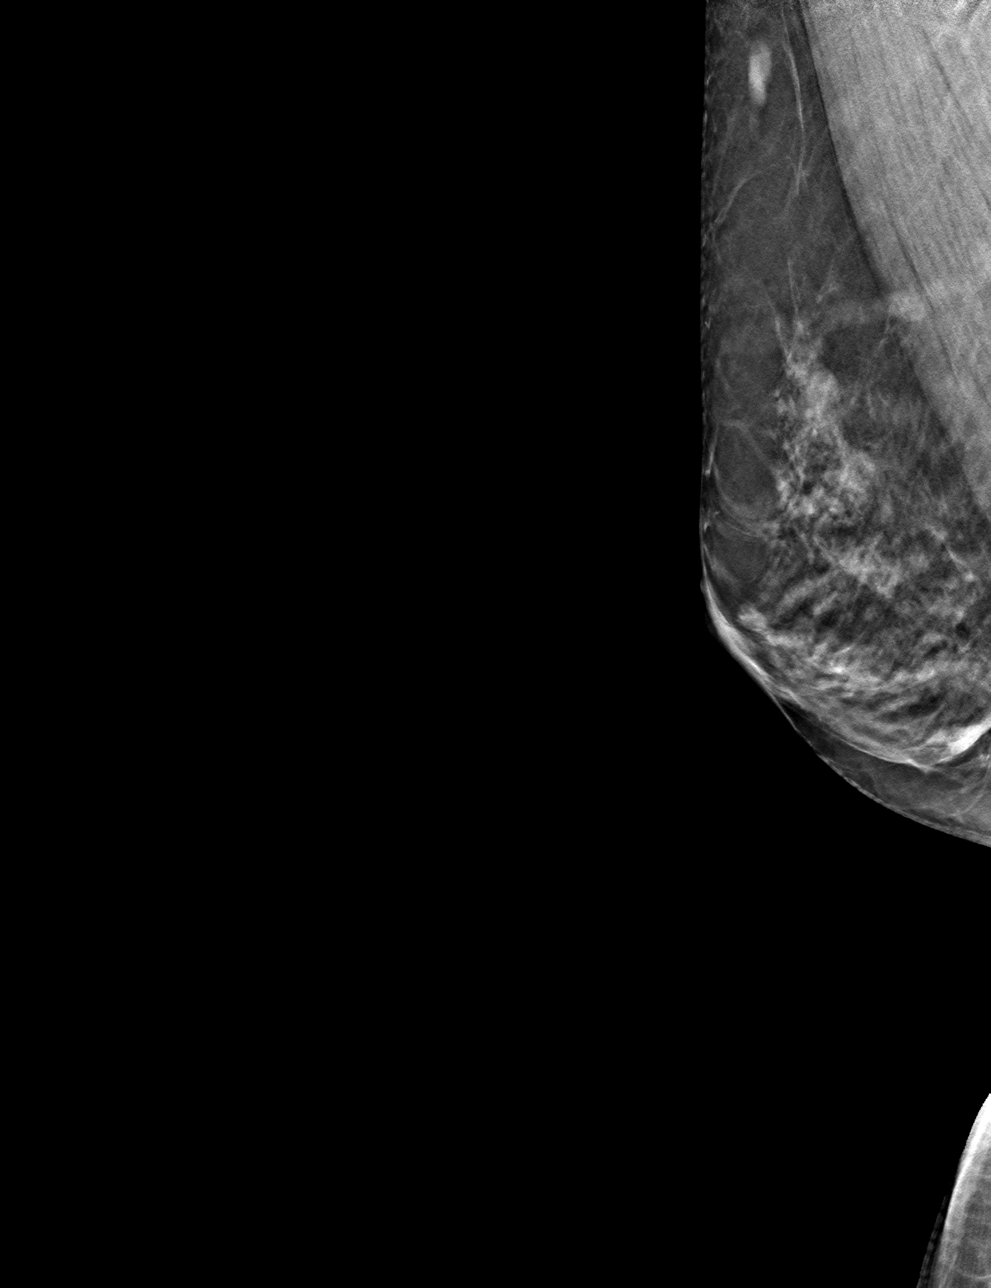

[4 of 12 positions shown; findings below may reference images not displayed]

ACR Breast Density Category c: The breast tissue is heterogeneously
dense, which may obscure small masses.
FINDINGS: Additional 2-D and 3-D images are performed. On additional views,
there is persistent asymmetry in the LOWER central portion of the
RIGHT breast and further evaluated sonographically. No discrete
mass, distortion, or suspicious microcalcifications are present.

Mammographic images were processed with CAD.

On physical exam, I palpate no abnormality in the LOWER portion of
the RIGHT breast.

Targeted ultrasound is performed, showing a dense fibroglandular
ridge in the 6 o'clock location of the RIGHT breast 3 centimeters
from the nipple, not associated with mass or acoustic shadowing.
IMPRESSION: Normal appearing fibroglandular ridge in the 6 o'clock location of
the RIGHT breast accounting for the mammographic abnormality. No
mammographic or ultrasound evidence for malignancy.

RECOMMENDATION:
Screening mammogram in one year.(Code:7H-[id number])

I have discussed the findings and recommendations with the patient.
Results were also provided in writing at the conclusion of the
visit. If applicable, a reminder letter will be sent to the patient
regarding the next appointment.

BI-RADS CATEGORY  1: Negative.

## 2019-07-24 ENCOUNTER — Other Ambulatory Visit: Payer: Self-pay | Admitting: Family Medicine

## 2019-07-24 DIAGNOSIS — I10 Essential (primary) hypertension: Secondary | ICD-10-CM

## 2019-07-24 DIAGNOSIS — R636 Underweight: Secondary | ICD-10-CM

## 2019-10-22 ENCOUNTER — Other Ambulatory Visit: Payer: Self-pay | Admitting: Family Medicine

## 2019-10-22 DIAGNOSIS — R636 Underweight: Secondary | ICD-10-CM

## 2019-10-22 DIAGNOSIS — I10 Essential (primary) hypertension: Secondary | ICD-10-CM

## 2019-12-29 ENCOUNTER — Ambulatory Visit: Payer: BC Managed Care – PPO | Attending: Internal Medicine

## 2019-12-29 DIAGNOSIS — Z20822 Contact with and (suspected) exposure to covid-19: Secondary | ICD-10-CM

## 2019-12-31 LAB — NOVEL CORONAVIRUS, NAA: SARS-CoV-2, NAA: NOT DETECTED

## 2020-01-20 ENCOUNTER — Other Ambulatory Visit: Payer: Self-pay | Admitting: Family Medicine

## 2020-01-20 DIAGNOSIS — R636 Underweight: Secondary | ICD-10-CM

## 2020-01-20 NOTE — Telephone Encounter (Signed)
Requested Prescriptions  Pending Prescriptions Disp Refills  . cyproheptadine (PERIACTIN) 4 MG tablet [Pharmacy Med Name: CYPROHEPTADINE 4MG  TABLETS] 90 tablet 0    Sig: TAKE 1 TABLET BY MOUTH THREE TIMES DAILY AS NEEDED FOR ALLERGIES     Ear, Nose, and Throat:  Antihistamines Passed - 01/20/2020  6:20 AM      Passed - Valid encounter within last 12 months    Recent Outpatient Visits          10 months ago Subacute ethmoidal sinusitis   [facility] [place]   1 year ago Mixed hyperlipidemia   [facility] [place], [facility], Jodell Cipro   1 year ago Mixed hyperlipidemia   [place], [employer], Jodell Cipro   1 year ago Urgency of urination   [facility], [place], Jodell Cipro   2 years ago Mixed hyperlipidemia   [facility], [place], [place]

## 2020-02-19 ENCOUNTER — Other Ambulatory Visit: Payer: Self-pay | Admitting: Family Medicine

## 2020-02-19 DIAGNOSIS — R636 Underweight: Secondary | ICD-10-CM

## 2020-05-08 NOTE — Progress Notes (Signed)
Inetta Fermo Cummings,acting as a Neurosurgeon for [employer], [employer].,have documented all relevant documentation on the behalf of [employer],as directed by  [employer] while in the presence of [employer], [place].  Established patient visit   Patient: Leah Shaw   DOB: Mar 26, 1961   59 y.o. Female  MRN: [id number] Visit Date: 05/10/2020  Today's healthcare provider: Dortha Kern, PA   Chief Complaint  Patient presents with   Nicotine Dependence   Subjective    HPI   Patient presents today in office to discuss stopping smoking. Patient says that she has tried the gum before to help her quit, she said that that did help her a little.    Past Surgical History:  Procedure Laterality Date   ABDOMINAL HYSTERECTOMY  2009   TUBAL LIGATION  1997   Social History   Tobacco Use   Smoking status: Current Every Day Smoker   Smokeless tobacco: Never Used  Substance Use Topics   Alcohol use: No    Alcohol/week: 0.0 standard drinks   Drug use: No   Family Status  Relation Name Status   Mother  Alive   Father  Alive   Brother  Alive       polycythemia vera   MGM  Alive   PGM  Deceased   PGF  Deceased   Sister  (Not Specified)   Allergies  Allergen Reactions   Sulfa Antibiotics Swelling    swelling face and tongue   Fenofibrate Other (See Comments)    Foot pains   Medications: Outpatient Medications Prior to Visit  Medication Sig   amLODipine (NORVASC) 10 MG tablet TAKE 1 TABLET BY MOUTH DAILY   aspirin 81 MG tablet Take by mouth.   cyproheptadine (PERIACTIN) 4 MG tablet TAKE 1 TABLET BY MOUTH THREE TIMES DAILY AS NEEDED FOR ALLERGIES   metoprolol tartrate (LOPRESSOR) 50 MG tablet TAKE 1 TABLET BY MOUTH TWICE DAILY   Multiple Vitamin tablet    zolmitriptan (ZOMIG ZMT) 5 MG disintegrating tablet Take by mouth.   doxycycline (VIBRA-TABS) 100 MG tablet Take 1 tablet (100 mg total) by mouth 2 (two) times daily. (Patient not  taking: Reported on 05/10/2020)   No facility-administered medications prior to visit.    Review of Systems    Objective    BP 124/81 (BP Location: Right Arm, Patient Position: Sitting, Cuff Size: Normal)    Pulse 63    Temp (!) 97.3 F (36.3 C) (Temporal)    Ht 5\' 4"  (1.626 m)    Wt 131 lb 6.4 oz (59.6 kg)    BMI 22.55 kg/m   Physical Exam Constitutional:      General: She is not in acute distress.    Appearance: She is well-developed.  HENT:     Head: Normocephalic and atraumatic.     Right Ear: Hearing normal.     Left Ear: Hearing normal.     Nose: Nose normal.  Eyes:     General: Lids are normal. No scleral icterus.       Right eye: No discharge.        Left eye: No discharge.     Conjunctiva/sclera: Conjunctivae normal.  Cardiovascular:     Rate and Rhythm: Normal rate and regular rhythm.     Heart sounds: Normal heart sounds.  Pulmonary:     Effort: Pulmonary effort is normal. No respiratory distress.  Abdominal:     Palpations: Abdomen is soft.  Musculoskeletal:  General: Normal range of motion.     Cervical back: Neck supple.  Skin:    Findings: No lesion or rash.  Neurological:     Mental Status: She is alert and oriented to person, place, and time.  Psychiatric:        Speech: Speech normal.        Behavior: Behavior normal.        Thought Content: Thought content normal.       No results found for any visits on 05/10/20.  Assessment & Plan     1. Compulsive tobacco user syndrome Has tried to taper down and use nicotine patches without much success. Back to 1 ppd now. History of smoking for the past 20 years. Father had severe COPD/emphysema and was a smoker. Recommend tapering down as she starts the Bupropion. Recheck in a month. - buPROPion (ZYBAN) 150 MG 12 hr tablet; Take 1 tablet (150 mg total) by mouth 2 (two) times daily.  Dispense: 60 tablet; Refill: 3  2. Encounter for smoking cessation counseling Counseled regarding diseases of  chronic smoking. Wants to quit because she doesn't want to smoke around her grandchildren. Given smoking cessation handouts and prescription for Zyban. Recheck in a month to assess progress.   No follow-ups on file.      Andres Shad, PA, have reviewed all documentation for this visit. The documentation on 05/10/20 for the exam, diagnosis, procedures, and orders are all accurate and complete.    Vernie Murders, [place] [phone] (phone) [phone] (fax)  [place]

## 2020-05-10 ENCOUNTER — Other Ambulatory Visit: Payer: Self-pay

## 2020-05-10 ENCOUNTER — Encounter: Payer: Self-pay | Admitting: Family Medicine

## 2020-05-10 ENCOUNTER — Ambulatory Visit: Payer: BC Managed Care – PPO | Admitting: Family Medicine

## 2020-05-10 VITALS — BP 124/81 | HR 63 | Temp 97.3°F | Ht 64.0 in | Wt 131.4 lb

## 2020-05-10 DIAGNOSIS — Z716 Tobacco abuse counseling: Secondary | ICD-10-CM | POA: Diagnosis not present

## 2020-05-10 DIAGNOSIS — F17200 Nicotine dependence, unspecified, uncomplicated: Secondary | ICD-10-CM | POA: Diagnosis not present

## 2020-05-10 MED ORDER — BUPROPION HCL ER (SMOKING DET) 150 MG PO TB12: 150.0000 mg | ORAL_TABLET | Freq: Two times a day (BID) | ORAL | 3 refills | Status: DC

## 2020-05-10 NOTE — Patient Instructions (Signed)
Coping with Quitting Smoking  Quitting smoking is a physical and mental challenge. You will face cravings, withdrawal symptoms, and temptation. Before quitting, work with your health care provider to make a plan that can help you cope. Preparation can help you quit and keep you from giving in. How can I cope with cravings? Cravings usually last for 5-10 minutes. If you get through it, the craving will pass. Consider taking the following actions to help you cope with cravings:  Keep your mouth busy: ? Chew sugar-free gum. ? Suck on hard candies or a straw. ? Brush your teeth.  Keep your hands and body busy: ? Immediately change to a different activity when you feel a craving. ? Squeeze or play with a ball. ? Do an activity or a hobby, like making bead jewelry, practicing needlepoint, or working with wood. ? Mix up your normal routine. ? Take a short exercise break. Go for a quick walk or run up and down stairs. ? Spend time in public places where smoking is not allowed.  Focus on doing something kind or helpful for someone else.  Call a friend or family member to talk during a craving.  Join a support group.  Call a quit line, such as 1-800-QUIT-NOW.  Talk with your health care provider about medicines that might help you cope with cravings and make quitting easier for you. How can I deal with withdrawal symptoms? Your body may experience negative effects as it tries to get used to not having nicotine in the system. These effects are called withdrawal symptoms. They may include:  Feeling hungrier than normal.  Trouble concentrating.  Irritability.  Trouble sleeping.  Feeling depressed.  Restlessness and agitation.  Craving a cigarette. To manage withdrawal symptoms:  Avoid places, people, and activities that trigger your cravings.  Remember why you want to quit.  Get plenty of sleep.  Avoid coffee and other caffeinated drinks. These may worsen some of your  symptoms. How can I handle social situations? Social situations can be difficult when you are quitting smoking, especially in the first few weeks. To manage this, you can:  Avoid parties, bars, and other social situations where people might be smoking.  Avoid alcohol.  Leave right away if you have the urge to smoke.  Explain to your family and friends that you are quitting smoking. Ask for understanding and support.  Plan activities with friends or family where smoking is not an option. What are some ways I can cope with stress? Wanting to smoke may cause stress, and stress can make you want to smoke. Find ways to manage your stress. Relaxation techniques can help. For example:  Breathe slowly and deeply, in through your nose and out through your mouth.  Listen to soothing, relaxing music.  Talk with a family member or friend about your stress.  Light a candle.  Soak in a bath or take a shower.  Think about a peaceful place. What are some ways I can prevent weight gain? Be aware that many people gain weight after they quit smoking. However, not everyone does. To keep from gaining weight, have a plan in place before you quit and stick to the plan after you quit. Your plan should include:  Having healthy snacks. When you have a craving, it may help to: ? Eat plain popcorn, crunchy carrots, celery, or other cut vegetables. ? Chew sugar-free gum.  Changing how you eat: ? Eat small portion sizes at meals. ? Eat 4-6 small meals   throughout the day instead of 1-2 large meals a day. ? Be mindful when you eat. Do not watch television or do other things that might distract you as you eat.  Exercising regularly: ? Make time to exercise each day. If you do not have time for a long workout, do short bouts of exercise for 5-10 minutes several times a day. ? Do some form of strengthening exercise, like weight lifting, and some form of aerobic exercise, like running or swimming.  Drinking  plenty of water or other low-calorie or no-calorie drinks. Drink 6-8 glasses of water daily, or as much as instructed by your health care provider. Summary  Quitting smoking is a physical and mental challenge. You will face cravings, withdrawal symptoms, and temptation to smoke again. Preparation can help you as you go through these challenges.  You can cope with cravings by keeping your mouth busy (such as by chewing gum), keeping your body and hands busy, and making calls to family, friends, or a helpline for people who want to quit smoking.  You can cope with withdrawal symptoms by avoiding places where people smoke, avoiding drinks with caffeine, and getting plenty of rest.  Ask your health care provider about the different ways to prevent weight gain, avoid stress, and handle social situations. This information is not intended to replace advice given to you by your health care provider. Make sure you discuss any questions you have with your health care provider. Document Revised: 10/22/2017 Document Reviewed: 11/06/2016 Elsevier Patient Education  [place]. Bupropion sustained-release tablets (smoking cessation) What is this medicine? BUPROPION (byoo PROE pee on) is used to help people quit smoking. This medicine may be used for other purposes; ask your health care provider or pharmacist if you have questions. COMMON BRAND NAME(S): Buproban, Zyban What should I tell my health care provider before I take this medicine? They need to know if you have any of these conditions:  an eating disorder, such as anorexia or bulimia  bipolar disorder or psychosis  diabetes or high blood sugar, treated with medication  glaucoma  head injury or brain tumor  heart disease, previous heart attack, or irregular heart beat  high blood pressure  kidney or liver disease  seizures  suicidal thoughts or a previous suicide attempt  Tourette's syndrome  weight loss  an unusual or  allergic reaction to bupropion, other medicines, foods, dyes, or preservatives  breast-feeding  pregnant or trying to become pregnant How should I use this medicine? Take this medicine by mouth with a glass of water. Follow the directions on the prescription label. You can take it with or without food. If it upsets your stomach, take it with food. Do not cut, crush or chew this medicine. Take your medicine at regular intervals. If you take this medicine more than once a day, take your second dose at least 8 hours after you take your first dose. To limit difficulty in sleeping, avoid taking this medicine at bedtime. Do not take your medicine more often than directed. Do not stop taking this medicine suddenly except upon the advice of your doctor. Stopping this medicine too quickly may cause serious side effects. A special MedGuide will be given to you by the pharmacist with each prescription and refill. Be sure to read this information carefully each time. Talk to your pediatrician regarding the use of this medicine in children. Special care may be needed. Overdosage: If you think you have taken too much of this medicine contact  a poison control center or emergency room at once. NOTE: This medicine is only for you. Do not share this medicine with others. What if I miss a dose? If you miss a dose, skip the missed dose and take your next tablet at the regular time. There should be at least 8 hours between doses. Do not take double or extra doses. What may interact with this medicine? Do not take this medicine with any of the following medications:  linezolid  MAOIs like Azilect, Carbex, Eldepryl, Marplan, Nardil, and Parnate  methylene blue (injected into a vein)  other medicines that contain bupropion like Wellbutrin This medicine may also interact with the following medications:  alcohol  certain medicines for anxiety or sleep  certain medicines for blood pressure like metoprolol,  propranolol  certain medicines for depression or psychotic disturbances  certain medicines for HIV or AIDS like efavirenz, lopinavir, nelfinavir, ritonavir  certain medicines for irregular heart beat like propafenone, flecainide  certain medicines for Parkinson's disease like amantadine, levodopa  certain medicines for seizures like carbamazepine, phenytoin, phenobarbital  cimetidine  clopidogrel  cyclophosphamide  digoxin  furazolidone  isoniazid  nicotine  orphenadrine  procarbazine  steroid medicines like prednisone or cortisone  stimulant medicines for attention disorders, weight loss, or to stay awake  tamoxifen  theophylline  thiotepa  ticlopidine  tramadol  warfarin This list may not describe all possible interactions. Give your health care provider a list of all the medicines, herbs, non-prescription drugs, or dietary supplements you use. Also tell them if you smoke, drink alcohol, or use illegal drugs. Some items may interact with your medicine. What should I watch for while using this medicine? Visit your doctor or healthcare provider for regular checks on your progress. This medicine should be used together with a patient support program. It is important to participate in a behavioral program, counseling, or other support program that is recommended by your healthcare provider. This medicine may cause serious skin reactions. They can happen weeks to months after starting the medicine. Contact your healthcare provider right away if you notice fevers or flu-like symptoms with a rash. The rash may be red or purple and then turn into blisters or peeling of the skin. Or, you might notice a red rash with swelling of the face, lips or lymph nodes in your neck or under your arms. Patients and their families should watch out for new or worsening thoughts of suicide or depression. Also watch out for sudden changes in feelings such as feeling anxious, agitated,  panicky, irritable, hostile, aggressive, impulsive, severely restless, overly excited and hyperactive, or not being able to sleep. If this happens, especially at the beginning of treatment or after a change in dose, call your healthcare provider. Avoid alcoholic drinks while taking this medicine. Drinking excessive alcoholic beverages, using sleeping or anxiety medicines, or quickly stopping the use of these agents while taking this medicine may increase your risk for a seizure. Do not drive or use heavy machinery until you know how this medicine affects you. This medicine can impair your ability to perform these tasks. Do not take this medicine close to bedtime. It may prevent you from sleeping. Your mouth may get dry. Chewing sugarless gum or sucking hard candy, and drinking plenty of water may help. Contact your doctor if the problem does not go away or is severe. Do not use nicotine patches or chewing gum without the advice of your doctor or healthcare provider while taking this medicine. You may need  to have your blood pressure taken regularly if your doctor recommends that you use both nicotine and this medicine together. What side effects may I notice from receiving this medicine? Side effects that you should report to your doctor or health care professional as soon as possible:  allergic reactions like skin rash, itching or hives, swelling of the face, lips, or tongue  breathing problems  changes in vision  confusion  elevated mood, decreased need for sleep, racing thoughts, impulsive behavior  fast or irregular heartbeat  hallucinations, loss of contact with reality  increased blood pressure  rash, fever, and swollen lymph nodes  redness, blistering, peeling, or loosening of the skin, including inside the mouth  seizures  suicidal thoughts or other mood changes  unusually weak or tired  vomiting Side effects that usually do not require medical attention (report to your  doctor or health care professional if they continue or are bothersome):  constipation  headache  loss of appetite  nausea  tremors  weight loss This list may not describe all possible side effects. Call your doctor for medical advice about side effects. You may report side effects to FDA at 1-800-FDA-1088. Where should I keep my medicine? Keep out of the reach of children. Store at room temperature between 20 and 25 degrees C (68 and 77 degrees F). Protect from light. Keep container tightly closed. Throw away any unused medicine after the expiration date. NOTE: This sheet is a summary. It may not cover all possible information. If you have questions about this medicine, talk to your doctor, pharmacist, or health care provider.  2020 Elsevier/Gold Standard (2019-02-02 13:59:09)

## 2020-05-19 ENCOUNTER — Other Ambulatory Visit: Payer: Self-pay | Admitting: Family Medicine

## 2020-05-19 DIAGNOSIS — R636 Underweight: Secondary | ICD-10-CM

## 2020-05-19 NOTE — Telephone Encounter (Signed)
Requested Prescriptions  Pending Prescriptions Disp Refills   cyproheptadine (PERIACTIN) 4 MG tablet [Pharmacy Med Name: CYPROHEPTADINE 4MG  TABLETS] 90 tablet 0    Sig: TAKE 1 TABLET BY MOUTH THREE TIMES DAILY AS NEEDED FOR ALLERGIES     Ear, Nose, and Throat:  Antihistamines Passed - 05/19/2020  6:21 AM      Passed - Valid encounter within last 12 months    Recent Outpatient Visits          1 week ago Compulsive tobacco user syndrome   [facility], [place], [place] Cipro   1 year ago Subacute ethmoidal sinusitis   [facility]., [place]   1 year ago Mixed hyperlipidemia   [facility] [place], [facility], Jodell Cipro   2 years ago Mixed hyperlipidemia   [place], [employer], Jodell Cipro   2 years ago Urgency of urination   [place], [employer], Jodell Cipro      Future Appointments            In 3 weeks [place], [place], [place] Jodell Cipro, [facility]

## 2020-06-10 ENCOUNTER — Ambulatory Visit: Payer: BC Managed Care – PPO | Admitting: Family Medicine

## 2020-07-26 ENCOUNTER — Other Ambulatory Visit: Payer: Self-pay

## 2020-07-26 ENCOUNTER — Other Ambulatory Visit: Payer: BC Managed Care – PPO

## 2020-07-26 DIAGNOSIS — Z20822 Contact with and (suspected) exposure to covid-19: Secondary | ICD-10-CM

## 2020-07-28 LAB — NOVEL CORONAVIRUS, NAA: SARS-CoV-2, NAA: NOT DETECTED

## 2020-07-30 ENCOUNTER — Ambulatory Visit: Payer: BC Managed Care – PPO | Admitting: Family Medicine

## 2020-08-17 ENCOUNTER — Other Ambulatory Visit: Payer: Self-pay | Admitting: Family Medicine

## 2020-08-17 DIAGNOSIS — R636 Underweight: Secondary | ICD-10-CM

## 2020-08-17 NOTE — Telephone Encounter (Signed)
Requested Prescriptions  Pending Prescriptions Disp Refills  . cyproheptadine (PERIACTIN) 4 MG tablet [Pharmacy Med Name: CYPROHEPTADINE 4MG  TABLETS] 270 tablet 0    Sig: TAKE 1 TABLET BY MOUTH THREE TIMES DAILY AS NEEDED FOR ALLERGIES     Ear, Nose, and Throat:  Antihistamines Passed - 08/17/2020  6:20 AM      Passed - Valid encounter within last 12 months    Recent Outpatient Visits          3 months ago Compulsive tobacco user syndrome   [facility] [employer], [place], Jodell Cipro   1 year ago Subacute ethmoidal sinusitis   [facility]., [place]   2 years ago Mixed hyperlipidemia   [facility] [place], [facility], Jodell Cipro   2 years ago Mixed hyperlipidemia   [place], [employer], Jodell Cipro   2 years ago Urgency of urination   [facility] [place], [place], [place]

## 2020-10-16 ENCOUNTER — Other Ambulatory Visit: Payer: Self-pay | Admitting: Family Medicine

## 2020-10-16 DIAGNOSIS — I10 Essential (primary) hypertension: Secondary | ICD-10-CM

## 2020-11-15 ENCOUNTER — Other Ambulatory Visit: Payer: Self-pay | Admitting: Family Medicine

## 2020-11-15 DIAGNOSIS — R636 Underweight: Secondary | ICD-10-CM

## 2020-11-15 NOTE — Telephone Encounter (Signed)
Requested medications are due for refill today yes  Requested medications are on the active medication list yes  Last refill 9/25  Last visit Do not see this med/dx addressed in OV  Future visit scheduled no  Notes to clinic Failed protocol due to no valid visit within 12  months.

## 2020-12-17 ENCOUNTER — Other Ambulatory Visit: Payer: Self-pay | Admitting: Family Medicine

## 2020-12-17 DIAGNOSIS — I10 Essential (primary) hypertension: Secondary | ICD-10-CM

## 2021-01-14 ENCOUNTER — Other Ambulatory Visit: Payer: Self-pay | Admitting: Family Medicine

## 2021-01-14 DIAGNOSIS — I10 Essential (primary) hypertension: Secondary | ICD-10-CM

## 2021-02-13 ENCOUNTER — Other Ambulatory Visit: Payer: Self-pay | Admitting: Family Medicine

## 2021-02-13 DIAGNOSIS — R636 Underweight: Secondary | ICD-10-CM

## 2021-02-13 NOTE — Telephone Encounter (Signed)
Requested Prescriptions  Pending Prescriptions Disp Refills  . cyproheptadine (PERIACTIN) 4 MG tablet [Pharmacy Med Name: CYPROHEPTADINE 4MG  TABLETS] 270 tablet 0    Sig: TAKE 1 TABLET BY MOUTH THREE TIMES DAILY AS NEEDED FOR ALLERGIES     Ear, Nose, and Throat:  Antihistamines Passed - 02/13/2021  6:20 AM      Passed - Valid encounter within last 12 months    Recent Outpatient Visits          9 months ago Compulsive tobacco user syndrome   [facility] [place], [facility], PA-C   1 year ago Subacute ethmoidal sinusitis   [facility]., [place]   2 years ago Mixed hyperlipidemia   [facility] [place], [facility], PA-C   2 years ago Mixed hyperlipidemia   J[place]ell Cipro, [employer], PA-C   3 years ago Urgency of urination   [place] Cipro, [employer], [place]od[place] Cipro

## 2021-04-22 ENCOUNTER — Other Ambulatory Visit: Payer: Self-pay | Admitting: Family Medicine

## 2021-04-22 DIAGNOSIS — I10 Essential (primary) hypertension: Secondary | ICD-10-CM

## 2021-04-22 NOTE — Telephone Encounter (Signed)
  Notes to clinic: Patient has scheduled appt for 05/08/2021 Review for short supply until that appt time    Requested Prescriptions  Pending Prescriptions Disp Refills   metoprolol tartrate (LOPRESSOR) 50 MG tablet [Pharmacy Med Name: METOPROLOL TARTRATE 50MG  TABLETS] 180 tablet 0    Sig: TAKE 1 TABLET BY MOUTH TWICE DAILY      Cardiovascular:  Beta Blockers Failed - 04/22/2021  1:54 PM      Failed - Valid encounter within last 6 months    Recent Outpatient Visits           11 months ago Compulsive tobacco user syndrome   [facility] [place], [facility], PA-C   2 years ago Subacute ethmoidal sinusitis   [facility]., MD   2 years ago Mixed hyperlipidemia   [facility] [place], [facility], PA-C   3 years ago Mixed hyperlipidemia   [place] Cipro, [employer], PA-C   3 years ago Urgency of urination   [place] Cipro, [employer], PA-C       Future Appointments             In 2 weeks [place], [place] Cipro, PA-C [facility] Cipro, [facility]             Passed - Last BP in normal range    BP Readings from Last 1 Encounters:  05/10/20 124/81          Passed - Last Heart Rate in normal range    Pulse Readings from Last 1 Encounters:  05/10/20 63            amLODipine (NORVASC) 10 MG tablet [Pharmacy Med Name: AMLODIPINE BESYLATE 10MG  TABLETS] 90 tablet 0    Sig: TAKE 1 TABLET BY MOUTH DAILY      Cardiovascular:  Calcium Channel Blockers Failed - 04/22/2021  1:54 PM      Failed - Valid encounter within last 6 months    Recent Outpatient Visits           11 months ago Compulsive tobacco user syndrome   [facility] [place], 04/24/2021, PA-C   2 years ago Subacute ethmoidal sinusitis   [facility] [place] Cipro., MD   2 years ago Mixed hyperlipidemia   [facility] [place], [place], PA-C   3 years ago Mixed  hyperlipidemia   [facility], J[place] Cipro, PA-C   3 years ago Urgency of urination   [employer], [place] Cipro, PA-C       Future Appointments             In 2 weeks [place], [employer], PA-C Jodell Cipro, [facility]             Passed - Last BP in normal range    BP Readings from Last 1 Encounters:  05/10/20 124/81

## 2021-05-08 ENCOUNTER — Other Ambulatory Visit: Payer: Self-pay

## 2021-05-08 ENCOUNTER — Ambulatory Visit: Payer: BC Managed Care – PPO | Admitting: Family Medicine

## 2021-05-08 ENCOUNTER — Encounter: Payer: Self-pay | Admitting: Family Medicine

## 2021-05-08 VITALS — BP 127/90 | HR 64 | Temp 98.3°F | Resp 16 | Ht 64.0 in | Wt 126.0 lb

## 2021-05-08 DIAGNOSIS — E7800 Pure hypercholesterolemia, unspecified: Secondary | ICD-10-CM | POA: Diagnosis not present

## 2021-05-08 DIAGNOSIS — I10 Essential (primary) hypertension: Secondary | ICD-10-CM

## 2021-05-08 NOTE — Progress Notes (Signed)
Established patient visit   Patient: Leah Shaw   DOB: [birth year]   60 y.o. Female  MRN: [id number] Visit Date: 05/08/2021  Today's healthcare provider: Dortha Kern, PA-C   Chief Complaint  Patient presents with   Hyperlipidemia   Hypertension   Subjective    HPI  Hypertension, follow-up  BP Readings from Last 3 Encounters:  05/08/21 127/90  05/10/20 124/81  03/03/19 122/84   Wt Readings from Last 3 Encounters:  05/08/21 126 lb (57.2 kg)  05/10/20 131 lb 6.4 oz (59.6 kg)  03/03/19 121 lb (54.9 kg)     She was last seen for hypertension 2 years ago.  BP at that visit was 122/84. Management since that visit includes no changes.  She reports excellent compliance with treatment. She is not having side effects. She is following a Low Sodium diet. She is exercising. She does smoke.  Use of agents associated with hypertension: none.   Outside blood pressures are not being checked. Symptoms: No chest pain No chest pressure  No palpitations No syncope  No dyspnea No orthopnea  No paroxysmal nocturnal dyspnea Yes lower extremity edema   Pertinent labs: Lab Results  Component Value Date   CHOL 179 06/06/2018   HDL 50 06/06/2018   LDLCALC 112 (H) 06/06/2018   TRIG 87 06/06/2018   CHOLHDL 3.6 06/06/2018   Lab Results  Component Value Date   NA 138 06/06/2018   K 4.2 06/06/2018   CREATININE 1.12 (H) 06/06/2018   GFRNONAA 56 (L) 06/06/2018   GFRAA 64 06/06/2018   GLUCOSE 81 06/06/2018     The 10-year ASCVD risk score Denman George DC Jr., et al., 2013) is: 7.1%   --------------------------------------------------------------------------------------------------- Lipid/Cholesterol, Follow-up  Last lipid panel Other pertinent labs  Lab Results  Component Value Date   CHOL 179 06/06/2018   HDL 50 06/06/2018   LDLCALC 112 (H) 06/06/2018   TRIG 87 06/06/2018   CHOLHDL 3.6 06/06/2018   Lab Results  Component Value Date   ALT 14 06/06/2018   AST 21  06/06/2018   PLT 262 06/06/2018   TSH 1.450 12/02/2017     She was last seen for this 3 years ago.  Management since that visit includes D/C Tricor/fenobibrates. Check labs. Patient was advised to follow up in 4-6 months to recheck labs.  She reports  no oral medication for cholesterol .   Symptoms: No chest pain No chest pressure/discomfort  No dyspnea Yes lower extremity edema  No numbness or tingling of extremity No orthopnea  No palpitations No paroxysmal nocturnal dyspnea  No speech difficulty No syncope   Current diet: in general, a "healthy" diet   Current exercise: no regular exercise and walking  The 10-year ASCVD risk score Denman George DC Jr., et al., 2013) is: 7.1%  ---------------------------------------------------------------------------------------------------   Patient Active Problem List   Diagnosis Date Noted   Hyperlipidemia 11/30/2017   Herpes zona 12/19/2015   History of endocrine, metabolic or immunity disorder 40/98/1191   BP (high blood pressure) 12/19/2015   Low weight 12/19/2015   Carpal tunnel syndrome 02/20/2010   Compulsive tobacco user syndrome 12/11/2008   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   Past Surgical History:  Procedure Laterality Date   ABDOMINAL HYSTERECTOMY  2009   TUBAL LIGATION  1997   Social History   Socioeconomic History   Marital status: Married    Spouse name: Not on file   Number of children: Not on file   Years of education: Not  on file   Highest education level: Not on file  Occupational History   Not on file  Tobacco Use   Smoking status: Every Day    Packs/day: 1.00    Years: 30.00    Pack years: 30.00    Types: Cigarettes   Smokeless tobacco: Never  Vaping Use   Vaping Use: Never used  Substance and Sexual Activity   Alcohol use: No    Alcohol/week: 0.0 standard drinks   Drug use: No   Sexual activity: Not on file  Other Topics Concern   Not on file  Social History Narrative   Not on file   Social  Determinants of Health   Financial Resource Strain: Not on file  Food Insecurity: Not on file  Transportation Needs: Not on file  Physical Activity: Not on file  Stress: Not on file  Social Connections: Not on file  Intimate Partner Violence: Not on file   Family History  Problem Relation Age of Onset   CAD Mother    Heart disease Mother    COPD Father    Heart attack Father    Breast cancer Sister 77   Healthy Sister    Polycythemia Half-Brother    Seizures Half-Brother    Healthy Daughter    Healthy Son    Heart attack Maternal Grandmother    Emphysema Paternal Grandmother    Allergies  Allergen Reactions   Sulfa Antibiotics Swelling    swelling face and tongue   Fenofibrate Other (See Comments)    Foot pains   Medications: Outpatient Medications Prior to Visit  Medication Sig   amLODipine (NORVASC) 10 MG tablet TAKE 1 TABLET BY MOUTH DAILY   aspirin 81 MG tablet Take by mouth.   cyproheptadine (PERIACTIN) 4 MG tablet TAKE 1 TABLET BY MOUTH THREE TIMES DAILY AS NEEDED FOR ALLERGIES   metoprolol tartrate (LOPRESSOR) 50 MG tablet TAKE 1 TABLET BY MOUTH TWICE DAILY   Multiple Vitamin tablet    [DISCONTINUED] buPROPion (ZYBAN) 150 MG 12 hr tablet Take 1 tablet (150 mg total) by mouth 2 (two) times daily.   [DISCONTINUED] doxycycline (VIBRA-TABS) 100 MG tablet Take 1 tablet (100 mg total) by mouth 2 (two) times daily. (Patient not taking: Reported on 05/10/2020)   [DISCONTINUED] zolmitriptan (ZOMIG ZMT) 5 MG disintegrating tablet Take by mouth.   No facility-administered medications prior to visit.    Review of Systems  Constitutional:  Negative for activity change and appetite change.  Eyes:  Negative for visual disturbance.  Respiratory:  Negative for chest tightness and shortness of breath.   Cardiovascular:  Positive for leg swelling. Negative for chest pain and palpitations.       Objective    BP 127/90 (BP Location: Right Arm, Patient Position: Sitting, Cuff  Size: Normal)   Pulse 64   Temp 98.3 F (36.8 C) (Oral)   Resp 16   Ht 5\' 4"  (1.626 m)   Wt 126 lb (57.2 kg)   SpO2 99%   BMI 21.63 kg/m  BP Readings from Last 3 Encounters:  05/08/21 127/90  05/10/20 124/81  03/03/19 122/84   Wt Readings from Last 3 Encounters:  05/08/21 126 lb (57.2 kg)  05/10/20 131 lb 6.4 oz (59.6 kg)  03/03/19 121 lb (54.9 kg)    Physical Exam Constitutional:      General: She is not in acute distress.    Appearance: She is well-developed.  HENT:     Head: Normocephalic and atraumatic.  Right Ear: Hearing and tympanic membrane normal.     Left Ear: Hearing and tympanic membrane normal.     Nose: Nose normal.  Eyes:     General: Lids are normal. No scleral icterus.       Right eye: No discharge.        Left eye: No discharge.     Conjunctiva/sclera: Conjunctivae normal.  Cardiovascular:     Rate and Rhythm: Normal rate and regular rhythm.     Heart sounds: Normal heart sounds.  Pulmonary:     Effort: Pulmonary effort is normal. No respiratory distress.     Breath sounds: Normal breath sounds.  Abdominal:     General: Bowel sounds are normal.     Palpations: Abdomen is soft.  Musculoskeletal:        General: Normal range of motion.     Cervical back: Normal range of motion and neck supple.  Skin:    Findings: No lesion or rash.  Neurological:     Mental Status: She is alert and oriented to person, place, and time.  Psychiatric:        Speech: Speech normal.        Behavior: Behavior normal.        Thought Content: Thought content normal.      No results found for any visits on 05/08/21.  Assessment & Plan     1. Primary hypertension Tolerating the Metoprolol and Amlodipine with good BP control. Still smoking 1 ppd. Some ankle puffiness by the end of the day. Suspect secondary to Amlodipine and should use support hose. - CBC with Differential/Platelet - Comprehensive metabolic panel - Lipid panel - TSH  2. Elevated LDL  cholesterol level Normal weight with BMI of 21, Recheck labs and follow low fat diet. - CBC with Differential/Platelet - Comprehensive metabolic panel - Lipid panel - TSH   No follow-ups on file.      I, Fransico Sciandra, PA-C, have reviewed all documentation for this visit. The documentation on 05/08/21 for the exam, diagnosis, procedures, and orders are all accurate and complete.    Dortha Kern, PA-C  [facility] [phone] (phone) [phone] (fax)  [facility]

## 2021-05-09 DIAGNOSIS — I10 Essential (primary) hypertension: Secondary | ICD-10-CM | POA: Diagnosis not present

## 2021-05-09 DIAGNOSIS — E7800 Pure hypercholesterolemia, unspecified: Secondary | ICD-10-CM | POA: Diagnosis not present

## 2021-05-10 LAB — COMPREHENSIVE METABOLIC PANEL
ALT: 17 IU/L (ref 0–32)
AST: 13 IU/L (ref 0–40)
Albumin/Globulin Ratio: 2 (ref 1.2–2.2)
Albumin: 4.5 g/dL (ref 3.8–4.9)
Alkaline Phosphatase: 105 IU/L (ref 44–121)
BUN/Creatinine Ratio: 16 (ref 9–23)
BUN: 16 mg/dL (ref 6–24)
Bilirubin Total: 0.3 mg/dL (ref 0.0–1.2)
CO2: 23 mmol/L (ref 20–29)
Calcium: 10 mg/dL (ref 8.7–10.2)
Chloride: 102 mmol/L (ref 96–106)
Creatinine, Ser: 0.97 mg/dL (ref 0.57–1.00)
Globulin, Total: 2.3 g/dL (ref 1.5–4.5)
Glucose: 94 mg/dL (ref 65–99)
Potassium: 4.3 mmol/L (ref 3.5–5.2)
Sodium: 141 mmol/L (ref 134–144)
Total Protein: 6.8 g/dL (ref 6.0–8.5)
eGFR: 68 mL/min/{1.73_m2} (ref >59)

## 2021-05-10 LAB — CBC WITH DIFFERENTIAL/PLATELET
Basophils Absolute: 0.1 10*3/uL (ref 0.0–0.2)
Basos: 1 %
EOS (ABSOLUTE): 0.3 10*3/uL (ref 0.0–0.4)
Eos: 3 %
Hematocrit: 44 % (ref 34.0–46.6)
Hemoglobin: 14.9 g/dL (ref 11.1–15.9)
Immature Grans (Abs): 0 10*3/uL (ref 0.0–0.1)
Immature Granulocytes: 0 %
Lymphocytes Absolute: 3 10*3/uL (ref 0.7–3.1)
Lymphs: 32 %
MCH: 31.8 pg (ref 26.6–33.0)
MCHC: 33.9 g/dL (ref 31.5–35.7)
MCV: 94 fL (ref 79–97)
Monocytes Absolute: 0.6 10*3/uL (ref 0.1–0.9)
Monocytes: 6 %
Neutrophils Absolute: 5.5 10*3/uL (ref 1.4–7.0)
Neutrophils: 58 %
Platelets: 276 10*3/uL (ref 150–450)
RBC: 4.69 x10E6/uL (ref 3.77–5.28)
RDW: 12.4 % (ref 11.7–15.4)
WBC: 9.4 10*3/uL (ref 3.4–10.8)

## 2021-05-10 LAB — LIPID PANEL
Chol/HDL Ratio: 4.4 ratio (ref 0.0–4.4)
Cholesterol, Total: 202 mg/dL — ABNORMAL HIGH (ref 100–199)
HDL: 46 mg/dL (ref >39)
LDL Chol Calc (NIH): 111 mg/dL — ABNORMAL HIGH (ref 0–99)
Triglycerides: 261 mg/dL — ABNORMAL HIGH (ref 0–149)
VLDL Cholesterol Cal: 45 mg/dL — ABNORMAL HIGH (ref 5–40)

## 2021-05-10 LAB — TSH: TSH: 1.35 u[IU]/mL (ref 0.450–4.500)

## 2021-05-13 ENCOUNTER — Telehealth: Payer: Self-pay

## 2021-05-13 DIAGNOSIS — E7800 Pure hypercholesterolemia, unspecified: Secondary | ICD-10-CM

## 2021-05-13 MED ORDER — SIMVASTATIN 20 MG PO TABS: 20.0000 mg | ORAL_TABLET | Freq: Every day | ORAL | 3 refills | Status: DC

## 2021-05-13 NOTE — Telephone Encounter (Signed)
-----   Message from Tamsen Roers, PA-C sent at 05/13/2021 12:03 AM EDT ----- All labs normal except triglycerides and LDL cholesterol elevated. Recommend a low fat diet, exercising for 30-40 minutes 3-4 days a week, increase water intake and add Simvastatin 20 mg qd #90 & 3RF. Recheck in 4 months.

## 2021-05-13 NOTE — Telephone Encounter (Signed)
Patient advised as directed below. Medication sent in to [facility] on [address] and [address].

## 2021-06-24 ENCOUNTER — Other Ambulatory Visit: Payer: Self-pay | Admitting: Family Medicine

## 2021-06-24 DIAGNOSIS — I10 Essential (primary) hypertension: Secondary | ICD-10-CM

## 2021-09-15 ENCOUNTER — Ambulatory Visit: Payer: Self-pay | Admitting: Family Medicine

## 2021-10-23 ENCOUNTER — Other Ambulatory Visit: Payer: Self-pay

## 2021-10-23 ENCOUNTER — Encounter: Payer: Self-pay | Admitting: Family Medicine

## 2021-10-23 ENCOUNTER — Telehealth: Payer: Self-pay

## 2021-10-23 ENCOUNTER — Other Ambulatory Visit: Payer: Self-pay | Admitting: Family Medicine

## 2021-10-23 ENCOUNTER — Ambulatory Visit (INDEPENDENT_AMBULATORY_CARE_PROVIDER_SITE_OTHER): Payer: BC Managed Care – PPO | Admitting: Family Medicine

## 2021-10-23 VITALS — BP 122/76 | HR 62 | Temp 98.2°F | Resp 18 | Wt 124.0 lb

## 2021-10-23 DIAGNOSIS — F17200 Nicotine dependence, unspecified, uncomplicated: Secondary | ICD-10-CM

## 2021-10-23 DIAGNOSIS — R636 Underweight: Secondary | ICD-10-CM

## 2021-10-23 DIAGNOSIS — I10 Essential (primary) hypertension: Secondary | ICD-10-CM

## 2021-10-23 DIAGNOSIS — Z23 Encounter for immunization: Secondary | ICD-10-CM | POA: Diagnosis not present

## 2021-10-23 DIAGNOSIS — E782 Mixed hyperlipidemia: Secondary | ICD-10-CM

## 2021-10-23 MED ORDER — AMLODIPINE BESYLATE 10 MG PO TABS: 10.0000 mg | ORAL_TABLET | Freq: Every day | ORAL | 3 refills | Status: DC

## 2021-10-23 MED ORDER — CYPROHEPTADINE HCL 4 MG PO TABS: ORAL_TABLET | ORAL | 3 refills | Status: DC

## 2021-10-23 MED ORDER — CYPROHEPTADINE HCL 4 MG PO TABS: 4.0000 mg | ORAL_TABLET | Freq: Three times a day (TID) | ORAL | 3 refills | Status: DC | PRN

## 2021-10-23 MED ORDER — METOPROLOL TARTRATE 50 MG PO TABS: 50.0000 mg | ORAL_TABLET | Freq: Two times a day (BID) | ORAL | 3 refills | Status: DC

## 2021-10-23 NOTE — Assessment & Plan Note (Signed)
Advised patient to pick up Coq10 OTC to assist with common cramps due to use of statin Encouraged any use is better than none- especially since patient continues to smoke

## 2021-10-23 NOTE — Telephone Encounter (Signed)
Copied from CRM [id number]. Topic: General - Other >> Oct 23, 2021  3:20 PM Gaetana Michaelis A wrote: Reason for CRM: Patient's pharmacy has called to request directions for patient's prescription for cyproheptadine (PERIACTIN) 4 MG tablet [[id number]]   Please contact further when possible

## 2021-10-23 NOTE — Progress Notes (Signed)
Established patient visit   Patient: Leah Shaw   DOB: 06-01-1961   60 y.o. Female  MRN: [id number] Visit Date: 10/23/2021  Today's healthcare provider: Gwyneth Sprout, FNP   Chief Complaint  Patient presents with   Hyperlipidemia   Hypertension   Subjective    HPI  Lipid/Cholesterol, Follow-up  Last lipid panel Other pertinent labs  Lab Results  Component Value Date   CHOL 202 (H) 05/09/2021   HDL 46 05/09/2021   LDLCALC 111 (H) 05/09/2021   TRIG 261 (H) 05/09/2021   CHOLHDL 4.4 05/09/2021   Lab Results  Component Value Date   ALT 17 05/09/2021   AST 13 05/09/2021   PLT 276 05/09/2021   TSH 1.350 05/09/2021     She was last seen for this 5 months ago.  Management since that visit includes adding Simvastatin 20 mg daily. Patient was advised to follow a low fat diet, exercising for 30-40 minutes 3-4 days a week and increase water intake.  She reports fair compliance with treatment. She is having side effects. Leg cramps and joint pain since starting Simvastatin. Patient stopped taking medication after being on it for 3 weeks.   Symptoms: No chest pain No chest pressure/discomfort  No dyspnea No lower extremity edema  No numbness or tingling of extremity No orthopnea  No palpitations No paroxysmal nocturnal dyspnea  No speech difficulty No syncope   Current diet: well balanced Current exercise: walking  The 10-year ASCVD risk score (Arnett DK, et al., 2019) is: 8.4%  ---------------------------------------------------------------------------------------------------   Hypertension, follow-up  BP Readings from Last 3 Encounters:  10/23/21 122/76  05/08/21 127/90  05/10/20 124/81   Wt Readings from Last 3 Encounters:  10/23/21 124 lb (56.2 kg)  05/08/21 126 lb (57.2 kg)  05/10/20 131 lb 6.4 oz (59.6 kg)     She was last seen for hypertension 5 months ago.  BP at that visit was 127/90. Management since that visit includes continuing same  medication.  She reports good compliance with treatment. She is not having side effects.  She is following a Regular diet. She is exercising. She does smoke.  Use of agents associated with hypertension: NSAIDS.   Outside blood pressures are not checked. Symptoms: No chest pain No chest pressure  No palpitations No syncope  No dyspnea No orthopnea  No paroxysmal nocturnal dyspnea No lower extremity edema   Pertinent labs: Lab Results  Component Value Date   CHOL 202 (H) 05/09/2021   HDL 46 05/09/2021   LDLCALC 111 (H) 05/09/2021   TRIG 261 (H) 05/09/2021   CHOLHDL 4.4 05/09/2021   Lab Results  Component Value Date   NA 141 05/09/2021   K 4.3 05/09/2021   CREATININE 0.97 05/09/2021   EGFR 68 05/09/2021   GLUCOSE 94 05/09/2021   TSH 1.350 05/09/2021     The 10-year ASCVD risk score (Arnett DK, et al., 2019) is: 8.4%   ---------------------------------------------------------------------------------------------------   Medications: Outpatient Medications Prior to Visit  Medication Sig   aspirin 81 MG tablet Take by mouth.   Multiple Vitamin tablet    [DISCONTINUED] amLODipine (NORVASC) 10 MG tablet TAKE 1 TABLET BY MOUTH DAILY   [DISCONTINUED] cyproheptadine (PERIACTIN) 4 MG tablet TAKE 1 TABLET BY MOUTH THREE TIMES DAILY AS NEEDED FOR ALLERGIES   [DISCONTINUED] metoprolol tartrate (LOPRESSOR) 50 MG tablet TAKE 1 TABLET BY MOUTH TWICE DAILY   simvastatin (ZOCOR) 20 MG tablet Take 1 tablet (20 mg total) by mouth daily. (  Patient not taking: Reported on 10/23/2021)   No facility-administered medications prior to visit.    Review of Systems  Constitutional:  Negative for appetite change, chills, fatigue and fever.  Respiratory:  Negative for chest tightness and shortness of breath.   Cardiovascular:  Negative for chest pain and palpitations.  Gastrointestinal:  Negative for abdominal pain, nausea and vomiting.  Neurological:  Negative for dizziness and weakness.       Objective    BP 122/76 (BP Location: Right Arm, Patient Position: Sitting, Cuff Size: Normal)   Pulse 62   Temp 98.2 F (36.8 C) (Oral)   Resp 18   Wt 124 lb (56.2 kg)   SpO2 98% Comment: room air  BMI 21.28 kg/m    Physical Exam Vitals and nursing note reviewed.  Constitutional:      General: She is not in acute distress.    Appearance: Normal appearance. She is normal weight. She is not ill-appearing, toxic-appearing or diaphoretic.  HENT:     Head: Normocephalic and atraumatic.  Cardiovascular:     Rate and Rhythm: Normal rate and regular rhythm.     Pulses: Normal pulses.     Heart sounds: Normal heart sounds. No murmur heard.   No friction rub. No gallop.  Pulmonary:     Effort: Pulmonary effort is normal. No respiratory distress.     Breath sounds: Normal breath sounds. No stridor. No wheezing, rhonchi or rales.  Chest:     Chest wall: No tenderness.  Abdominal:     General: Bowel sounds are normal.     Palpations: Abdomen is soft.  Musculoskeletal:        General: No swelling, tenderness, deformity or signs of injury. Normal range of motion.     Right lower leg: No edema.     Left lower leg: No edema.  Skin:    General: Skin is warm and dry.     Capillary Refill: Capillary refill takes less than 2 seconds.     Coloration: Skin is not jaundiced or pale.     Findings: No bruising, erythema, lesion or rash.  Neurological:     General: No focal deficit present.     Mental Status: She is alert and oriented to person, place, and time. Mental status is at baseline.     Cranial Nerves: No cranial nerve deficit.     Sensory: No sensory deficit.     Motor: No weakness.     Coordination: Coordination normal.  Psychiatric:        Mood and Affect: Mood normal.        Behavior: Behavior normal.        Thought Content: Thought content normal.        Judgment: Judgment normal.     No results found for any visits on 10/23/21.  Assessment & Plan     Problem List  Items Addressed This Visit       Cardiovascular and Mediastinum   BP (high blood pressure)    Chronic, stable No longer experiencing swelling side effects from calcium channel blocker having increased water consumption       Relevant Medications   amLODipine (NORVASC) 10 MG tablet   metoprolol tartrate (LOPRESSOR) 50 MG tablet     Other   Compulsive tobacco user syndrome    Not ready to quit Has tried gums, lozenges, wellbutrin and chantix prior Addressed the tobacco cessation referral process as well as 1 800 QUIT NOW and pt continued to have  hesitation  Continue to discuss at each OV/CPE       Hyperlipidemia    Advised patient to pick up Coq10 OTC to assist with common cramps due to use of statin Encouraged any use is better than none- especially since patient continues to smoke      Relevant Medications   amLODipine (NORVASC) 10 MG tablet   metoprolol tartrate (LOPRESSOR) 50 MG tablet   RESOLVED: Low weight   Relevant Medications   cyproheptadine (PERIACTIN) 4 MG tablet   Other Visit Diagnoses     Need for influenza vaccination    -  Primary   Relevant Orders   Flu Vaccine QUAD 36+ mos IM (Fluarix/Fluzone)   Essential hypertension       Relevant Medications   amLODipine (NORVASC) 10 MG tablet   metoprolol tartrate (LOPRESSOR) 50 MG tablet        Return in about 6 months (around 04/23/2022) for annual examination.      Vonna Kotyk, FNP, have reviewed all documentation for this visit. The documentation on 10/23/21 for the exam, diagnosis, procedures, and orders are all accurate and complete.    Gwyneth Sprout, [place] [phone] (phone) [phone] (fax)  [place]

## 2021-10-23 NOTE — Assessment & Plan Note (Signed)
Not ready to quit Has tried gums, lozenges, wellbutrin and chantix prior Addressed the tobacco cessation referral process as well as 1 800 QUIT NOW and pt continued to have hesitation  Continue to discuss at each OV/CPE

## 2021-10-23 NOTE — Assessment & Plan Note (Signed)
Chronic, stable No longer experiencing swelling side effects from calcium channel blocker having increased water consumption

## 2021-12-25 ENCOUNTER — Telehealth: Payer: BC Managed Care – PPO | Admitting: Emergency Medicine

## 2021-12-25 DIAGNOSIS — J441 Chronic obstructive pulmonary disease with (acute) exacerbation: Secondary | ICD-10-CM

## 2021-12-25 MED ORDER — SPACER/AERO-HOLDING CHAMBERS DEVI: 1.0000 | 0 refills | Status: DC | PRN

## 2021-12-25 MED ORDER — AMOXICILLIN-POT CLAVULANATE 875-125 MG PO TABS: 1.0000 | ORAL_TABLET | Freq: Two times a day (BID) | ORAL | 0 refills | Status: DC

## 2021-12-25 MED ORDER — PREDNISONE 5 MG (21) PO TBPK: ORAL_TABLET | ORAL | 0 refills | Status: DC

## 2021-12-25 MED ORDER — ALBUTEROL SULFATE HFA 108 (90 BASE) MCG/ACT IN AERS: 2.0000 | INHALATION_SPRAY | Freq: Four times a day (QID) | RESPIRATORY_TRACT | 0 refills | Status: DC | PRN

## 2021-12-25 NOTE — Patient Instructions (Signed)
Leah Shaw, thank you for joining Cathlyn Parsons, NP for today's virtual visit.  While this provider is not your primary care provider (PCP), if your PCP is located in our provider database this encounter information will be shared with them immediately following your visit.  Consent: (Patient) Leah Shaw provided verbal consent for this virtual visit at the beginning of the encounter.  Current Medications:  Current Outpatient Medications:    albuterol (VENTOLIN HFA) 108 (90 Base) MCG/ACT inhaler, Inhale 2 puffs into the lungs every 6 (six) hours as needed for wheezing or shortness of breath., Disp: 8 g, Rfl: 0   amoxicillin-clavulanate (AUGMENTIN) 875-125 MG tablet, Take 1 tablet by mouth 2 (two) times daily., Disp: 20 tablet, Rfl: 0   predniSONE (STERAPRED UNI-PAK 21 TAB) 5 MG (21) TBPK tablet, Take tapered steroid pack as directed, Disp: 21 tablet, Rfl: 0   Spacer/Aero-Holding Chambers DEVI, 1 each by Does not apply route as needed., Disp: 1 each, Rfl: 0   amLODipine (NORVASC) 10 MG tablet, Take 1 tablet (10 mg total) by mouth daily., Disp: 90 tablet, Rfl: 3   aspirin 81 MG tablet, Take by mouth., Disp: , Rfl:    cyproheptadine (PERIACTIN) 4 MG tablet, Take 1 tablet (4 mg total) by mouth 3 (three) times daily as needed for allergies., Disp: 270 tablet, Rfl: 3   metoprolol tartrate (LOPRESSOR) 50 MG tablet, Take 1 tablet (50 mg total) by mouth 2 (two) times daily., Disp: 180 tablet, Rfl: 3   Multiple Vitamin tablet, , Disp: , Rfl:    simvastatin (ZOCOR) 20 MG tablet, Take 1 tablet (20 mg total) by mouth daily. (Patient not taking: Reported on 10/23/2021), Disp: 90 tablet, Rfl: 3   Medications ordered in this encounter:  Meds ordered this encounter  Medications   albuterol (VENTOLIN HFA) 108 (90 Base) MCG/ACT inhaler    Sig: Inhale 2 puffs into the lungs every 6 (six) hours as needed for wheezing or shortness of breath.    Dispense:  8 g    Refill:  0   Spacer/Aero-Holding  Chambers DEVI    Sig: 1 each by Does not apply route as needed.    Dispense:  1 each    Refill:  0   amoxicillin-clavulanate (AUGMENTIN) 875-125 MG tablet    Sig: Take 1 tablet by mouth 2 (two) times daily.    Dispense:  20 tablet    Refill:  0   predniSONE (STERAPRED UNI-PAK 21 TAB) 5 MG (21) TBPK tablet    Sig: Take tapered steroid pack as directed    Dispense:  21 tablet    Refill:  0     *If you need refills on other medications prior to your next appointment, please contact your pharmacy*  Follow-Up: Call back or seek an in-person evaluation if the symptoms worsen or if the condition fails to improve as anticipated.  Other Instructions Continue using Mucinex DM.  Make sure to drink lots of liquids and stay hydrated.  Saline nasal spray can also be very helpful in handling nasal congestion.  Use the albuterol inhaler with a spacer every 4-6 hours if you need it.  If this treatment plan is not relieving your symptoms or if you begin feeling worse and increasingly short of breath, you will need to be seen in person either at an ER or an urgent care.   If you have been instructed to have an in-person evaluation today at a local Urgent Care facility, please use the  link below. It will take you to a list of all of our available [place] [facility], including address, phone number and hours of operation. Please do not delay care.  [facility]  If you or a family member do not have a primary care provider, use the link below to schedule a visit and establish care. When you choose a [facility] primary care physician or advanced practice provider, you gain a long-term partner in health. Find a Primary Care Provider  Learn more about [facility]'s in-office and virtual care options: [facility]

## 2021-12-25 NOTE — Progress Notes (Addendum)
Virtual Visit Consent   Leah Shaw, you are scheduled for a virtual visit with a [facility] provider today.     Just as with appointments in the office, your consent must be obtained to participate.  Your consent will be active for this visit and any virtual visit you may have with one of our providers in the next 365 days.     If you have a MyChart account, a copy of this consent can be sent to you electronically.  All virtual visits are billed to your insurance company just like a traditional visit in the office.    As this is a virtual visit, video technology does not allow for your provider to perform a traditional examination.  This may limit your provider's ability to fully assess your condition.  If your provider identifies any concerns that need to be evaluated in person or the need to arrange testing (such as labs, EKG, etc.), we will make arrangements to do so.     Although advances in technology are sophisticated, we cannot ensure that it will always work on either your end or our end.  If the connection with a video visit is poor, the visit may have to be switched to a telephone visit.  With either a video or telephone visit, we are not always able to ensure that we have a secure connection.     I need to obtain your verbal consent now.   Are you willing to proceed with your visit today?    ARLETTE SCHAAD has provided verbal consent on 12/25/2021 for a virtual visit (video or telephone).   Cathlyn Parsons, NP   Date: 12/25/2021 4:24 PM   Virtual Visit via Video Note   I, Cathlyn Parsons, connected with  Leah Shaw  ([id number], February 09, 1961) on 12/25/21 at  4:15 PM EST by a video-enabled telemedicine application and verified that I am speaking with the correct person using two identifiers.  Location: Patient: Virtual Visit Location Patient: Home Provider: Virtual Visit Location Provider: Home Office   I discussed the limitations of evaluation and management by  telemedicine and the availability of in person appointments. The patient expressed understanding and agreed to proceed.    History of Present Illness: Leah Shaw is a 61 y.o. who identifies as a female who was assigned female at birth, and is being seen today for cough, congestion, and shortness of breath.  Patient reports head cold symptoms 4 days ago including nasal congestion, sore throat and headache.  She thought maybe she had COVID so she tested herself with a home test but it was negative.  Since then she has been treating her symptoms with Mucinex DM and feels her symptoms are little bit better when she takes it.  However, in the last day she has been "coughing and coughing" and producing both green sputum and green nasal discharge.  She is a smoker.  She does report significant postnasal drainage.  She denies wheezing, fever, chills.    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    Allergies:  Allergies  Allergen Reactions   Sulfa Antibiotics Swelling    swelling face and tongue  Fenofibrate Other (See Comments)    Foot pains   Medications:  Current Outpatient Medications:    albuterol (VENTOLIN HFA) 108 (90 Base) MCG/ACT inhaler, Inhale 2 puffs into the lungs every 6 (six) hours as needed for wheezing or shortness of breath., Disp: 8 g, Rfl: 0   amoxicillin-clavulanate (AUGMENTIN) 875-125 MG tablet, Take 1 tablet by mouth 2 (two) times daily., Disp: 20 tablet, Rfl: 0   predniSONE (STERAPRED UNI-PAK 21 TAB) 5 MG (21) TBPK tablet, Take tapered steroid pack as directed, Disp: 21 tablet, Rfl: 0   Spacer/Aero-Holding Chambers DEVI, 1 each by Does not apply route as needed.,  Disp: 1 each, Rfl: 0   amLODipine (NORVASC) 10 MG tablet, Take 1 tablet (10 mg total) by mouth daily., Disp: 90 tablet, Rfl: 3   aspirin 81 MG tablet, Take by mouth., Disp: , Rfl:    cyproheptadine (PERIACTIN) 4 MG tablet, Take 1 tablet (4 mg total) by mouth 3 (three) times daily as needed for allergies., Disp: 270 tablet, Rfl: 3   metoprolol tartrate (LOPRESSOR) 50 MG tablet, Take 1 tablet (50 mg total) by mouth 2 (two) times daily., Disp: 180 tablet, Rfl: 3   Multiple Vitamin tablet, , Disp: , Rfl:    simvastatin (ZOCOR) 20 MG tablet, Take 1 tablet (20 mg total) by mouth daily. (Patient not taking: Reported on 10/23/2021), Disp: 90 tablet, Rfl: 3  Observations/Objective: Patient is well-developed, well-nourished in no acute distress.  Resting comfortably  at home.  Head is normocephalic, atraumatic.  No labored breathing.  She is speaking in complete sentences without respiratory distress Speech is clear and coherent with logical content.  Patient is alert and oriented at baseline.  She does sound congested.  Assessment and Plan: 1. COPD exacerbation (HCC)  Patient reports she does not have a history of COPD, however given her smoking history I am inclined to treat her symptoms as a COPD exacerbation.  It may also be that she has a sinus infection, and the postnasal drainage is triggering her cough.  We discussed the best way to manage her symptoms.  We will try Augmentin, albuterol, and prednisone.  We discussed reasons for seeking higher level of in person care.  Follow Up Instructions: I discussed the assessment and treatment plan with the patient. The patient was provided an opportunity to ask questions and all were answered. The patient agreed with the plan and demonstrated an understanding of the instructions.  A copy of instructions were sent to the patient via MyChart unless otherwise noted below.   The patient was advised to call back or seek an in-person evaluation if the symptoms  worsen or if the condition fails to improve as anticipated.  Time:  I spent 8 minutes with the patient via telehealth technology discussing the above problems/concerns.    Cathlyn Parsons, NP

## 2022-06-11 ENCOUNTER — Telehealth: Payer: Self-pay | Admitting: Family Medicine

## 2022-06-11 NOTE — Telephone Encounter (Signed)
[facility] faxed refill request for the following medications:   simvastatin (ZOCOR) 20 MG tablet   Please advise.  

## 2022-06-12 ENCOUNTER — Other Ambulatory Visit: Payer: Self-pay

## 2022-06-12 DIAGNOSIS — E7800 Pure hypercholesterolemia, unspecified: Secondary | ICD-10-CM

## 2022-06-12 MED ORDER — SIMVASTATIN 20 MG PO TABS: 20.0000 mg | ORAL_TABLET | Freq: Every day | ORAL | 3 refills | Status: DC

## 2022-08-28 ENCOUNTER — Ambulatory Visit: Payer: BC Managed Care – PPO | Admitting: Family Medicine

## 2022-08-28 ENCOUNTER — Encounter: Payer: Self-pay | Admitting: Family Medicine

## 2022-08-28 ENCOUNTER — Ambulatory Visit: Payer: Self-pay

## 2022-08-28 VITALS — BP 120/82 | HR 66 | Temp 98.2°F | Resp 16 | Ht 63.0 in | Wt 124.0 lb

## 2022-08-28 DIAGNOSIS — J449 Chronic obstructive pulmonary disease, unspecified: Secondary | ICD-10-CM

## 2022-08-28 DIAGNOSIS — R220 Localized swelling, mass and lump, head: Secondary | ICD-10-CM | POA: Diagnosis not present

## 2022-08-28 DIAGNOSIS — J329 Chronic sinusitis, unspecified: Secondary | ICD-10-CM | POA: Insufficient documentation

## 2022-08-28 MED ORDER — ALBUTEROL SULFATE HFA 108 (90 BASE) MCG/ACT IN AERS: 2.0000 | INHALATION_SPRAY | Freq: Four times a day (QID) | RESPIRATORY_TRACT | 0 refills | Status: DC | PRN

## 2022-08-28 MED ORDER — FLUTICASONE PROPIONATE 50 MCG/ACT NA SUSP: 2.0000 | Freq: Every day | NASAL | 6 refills | Status: DC

## 2022-08-28 MED ORDER — FLUTICASONE-SALMETEROL 100-50 MCG/ACT IN AEPB: 1.0000 | INHALATION_SPRAY | Freq: Two times a day (BID) | RESPIRATORY_TRACT | 3 refills | Status: DC

## 2022-08-28 MED ORDER — LEVOFLOXACIN 500 MG PO TABS: 500.0000 mg | ORAL_TABLET | Freq: Every day | ORAL | 0 refills | Status: AC

## 2022-08-28 NOTE — Assessment & Plan Note (Signed)
Pt reports 1+ week history of R sinus pain and pressure into ear; now with complaints of R ear pain and jaw pain. Denies sick contacts. Hx of tobacco abuse.

## 2022-08-28 NOTE — Telephone Encounter (Signed)
  Chief Complaint: Facial swelling, ear pain Symptoms: ibid Frequency: yesterday Pertinent Negatives: Patient denies fever Disposition: [] ED /[] Urgent Care (no appt availability in office) / [x] Appointment(In office/virtual)/ []  [place] [facility]/ [] Home Care/ [] Refused Recommended Disposition /[] [facility] [place] [facility]/ []  Follow-up with PCP Additional Notes: Spoke very briefly with pt as she was coming to office for 9am appt.  Pt reports right sided facial swelling with ear pain.   Reason for Disposition  Face swelling is painful to touch  Answer Assessment - Initial Assessment Questions 1. ONSET: "When did the swelling start?" (e.g., minutes, hours, days)     yesterday 2. LOCATION: "What part of the face is swollen?"     Right side 3. SEVERITY: "How swollen is it?"      4. ITCHING: "Is there any itching?" If Yes, ask: "How much?"   (Scale 1-10; mild, moderate or severe)      5. PAIN: "Is the swelling painful to touch?" If Yes, ask: "How painful is it?"   (Scale 1-10; mild, moderate or severe)   - NONE (0): no pain   - MILD (1-3): doesn't interfere with normal activities    - MODERATE (4-7): interferes with normal activities or awakens from sleep    - SEVERE (8-10): excruciating pain, unable to do any normal activities       6. FEVER: "Do you have a fever?" If Yes, ask: "What is it, how was it measured, and when did it start?"      no 7. CAUSE: "What do you think is causing the face swelling?"      8. RECURRENT SYMPTOM: "Have you had face swelling before?" If Yes, ask: "When was the last time?" "What happened that time?"      9. OTHER SYMPTOMS: "Do you have any other symptoms?" (e.g., toothache, leg swelling)      10. PREGNANCY: "Is there any chance you are pregnant?" "When was your last menstrual period?"  Protocols used: Face Swelling-A-AH

## 2022-08-28 NOTE — Assessment & Plan Note (Signed)
Acute, denies dental concerns Hx of sinus issues for 1 week now with ear complaints

## 2022-08-28 NOTE — Assessment & Plan Note (Signed)
Chronic, stable Recommend Abx for sinusitis to cover pseudomonas  Recommend start on controller OK for refill of SABA  Cessation discussed; pt remains pre contemplative

## 2022-08-28 NOTE — Progress Notes (Signed)
Established patient visit   Patient: Leah Shaw   DOB: 1961/10/07   61 y.o. Female  MRN: [id number] Visit Date: 08/28/2022  Today's healthcare provider: Jacky Kindle, FNP Re Introduced to nurse practitioner role and practice setting.  All questions answered.  Discussed provider/patient relationship and expectations.   I,Tiffany J Bragg,acting as a scribe for Jacky Kindle, FNP.,have documented all relevant documentation on the behalf of Jacky Kindle, FNP,as directed by  Jacky Kindle, FNP while in the presence of Jacky Kindle, FNP.    Chief Complaint  Patient presents with   Jaw Pain    Patient complains of R side jaw pain and swelling under ear since yesterday. Had ear congestion on that side last week.    Subjective    HPI HPI     Jaw Pain    Additional comments: Patient complains of R side jaw pain and swelling under ear since yesterday. Had ear congestion on that side last week.       Last edited by Marlana Salvage, CMA on 08/28/2022  9:02 AM.       Medications: Outpatient Medications Prior to Visit  Medication Sig   amLODipine (NORVASC) 10 MG tablet Take 1 tablet (10 mg total) by mouth daily.   aspirin 81 MG tablet Take by mouth.   cyproheptadine (PERIACTIN) 4 MG tablet Take 1 tablet (4 mg total) by mouth 3 (three) times daily as needed for allergies.   metoprolol tartrate (LOPRESSOR) 50 MG tablet Take 1 tablet (50 mg total) by mouth 2 (two) times daily.   Multiple Vitamin tablet    simvastatin (ZOCOR) 20 MG tablet Take 1 tablet (20 mg total) by mouth daily.   Spacer/Aero-Holding Chambers DEVI 1 each by Does not apply route as needed.   [DISCONTINUED] albuterol (VENTOLIN HFA) 108 (90 Base) MCG/ACT inhaler Inhale 2 puffs into the lungs every 6 (six) hours as needed for wheezing or shortness of breath.   [DISCONTINUED] amoxicillin-clavulanate (AUGMENTIN) 875-125 MG tablet Take 1 tablet by mouth 2 (two) times daily.   [DISCONTINUED] predniSONE (STERAPRED  UNI-PAK 21 TAB) 5 MG (21) TBPK tablet Take tapered steroid pack as directed   No facility-administered medications prior to visit.    Review of Systems    Objective    BP 120/82 (BP Location: Right Arm, Patient Position: Sitting, Cuff Size: Normal)   Pulse 66   Temp 98.2 F (36.8 C) (Oral)   Resp 16   Ht 5\' 3"  (1.6 m)   Wt 124 lb (56.2 kg)   SpO2 99%   BMI 21.97 kg/m   Physical Exam Vitals and nursing note reviewed.  Constitutional:      General: She is not in acute distress.    Appearance: Normal appearance. She is normal weight. She is not ill-appearing, toxic-appearing or diaphoretic.  HENT:     Head: Normocephalic and atraumatic.     Right Ear: Tympanic membrane, ear canal and external ear normal.     Left Ear: Tympanic membrane, ear canal and external ear normal.     Nose: Nose normal.     Mouth/Throat:     Mouth: Mucous membranes are moist.     Pharynx: Oropharynx is clear. No oropharyngeal exudate or posterior oropharyngeal erythema.     Comments: Jaw pain and tenderness when biting down on R side  Eyes:     Extraocular Movements: Extraocular movements intact.     Conjunctiva/sclera: Conjunctivae normal.  Pupils: Pupils are equal, round, and reactive to light.  Neck:     Comments: Along R jaw line; no palpable lymph nodes  Cardiovascular:     Rate and Rhythm: Normal rate and regular rhythm.     Pulses: Normal pulses.     Heart sounds: Normal heart sounds. No murmur heard.    No friction rub. No gallop.  Pulmonary:     Effort: Pulmonary effort is normal. No respiratory distress.     Breath sounds: Normal breath sounds. No stridor. No wheezing, rhonchi or rales.  Chest:     Chest wall: No tenderness.  Abdominal:     General: Bowel sounds are normal.     Palpations: Abdomen is soft.  Musculoskeletal:        General: No swelling, deformity or signs of injury. Normal range of motion.     Cervical back: Tenderness present.     Right lower leg: No edema.      Left lower leg: No edema.  Skin:    General: Skin is warm and dry.     Capillary Refill: Capillary refill takes less than 2 seconds.     Coloration: Skin is not jaundiced or pale.     Findings: No bruising, erythema, lesion or rash.  Neurological:     General: No focal deficit present.     Mental Status: She is alert and oriented to person, place, and time. Mental status is at baseline.     Cranial Nerves: No cranial nerve deficit.     Sensory: No sensory deficit.     Motor: No weakness.     Coordination: Coordination normal.  Psychiatric:        Mood and Affect: Mood normal.        Behavior: Behavior normal.        Thought Content: Thought content normal.        Judgment: Judgment normal.     No results found for any visits on 08/28/22.  Assessment & Plan     Problem List Items Addressed This Visit       Respiratory   Chronic obstructive pulmonary disease ([place])    Chronic, stable Recommend Abx for sinusitis to cover pseudomonas  Recommend start on controller OK for refill of SABA  Cessation discussed; pt remains pre contemplative       Relevant Medications   fluticasone (FLONASE) 50 MCG/ACT nasal spray   fluticasone-salmeterol (ADVAIR) 100-50 MCG/ACT AEPB   albuterol (VENTOLIN HFA) 108 (90 Base) MCG/ACT inhaler   Recurrent sinusitis    Pt reports 1+ week history of R sinus pain and pressure into ear; now with complaints of R ear pain and jaw pain. Denies sick contacts. Hx of tobacco abuse.       Relevant Medications   levofloxacin (LEVAQUIN) 500 MG tablet   fluticasone (FLONASE) 50 MCG/ACT nasal spray     Other   Jaw swelling - Primary    Acute, denies dental concerns Hx of sinus issues for 1 week now with ear complaints       Relevant Medications   levofloxacin (LEVAQUIN) 500 MG tablet   fluticasone (FLONASE) 50 MCG/ACT nasal spray     Return if symptoms worsen or fail to improve.      Vonna Kotyk, FNP, have reviewed all documentation for this  visit. The documentation on 08/28/22 for the exam, diagnosis, procedures, and orders are all accurate and complete.    Gwyneth Sprout, FNP  [facility] [phone] (  phone) [phone] (fax)  [place]

## 2022-09-10 ENCOUNTER — Other Ambulatory Visit: Payer: Self-pay | Admitting: Family Medicine

## 2022-09-10 DIAGNOSIS — I10 Essential (primary) hypertension: Secondary | ICD-10-CM

## 2022-09-11 NOTE — Telephone Encounter (Signed)
Requested Prescriptions  Pending Prescriptions Disp Refills  . amLODipine (NORVASC) 10 MG tablet [Pharmacy Med Name: AMLODIPINE BESYLATE 10MG  TABLETS] 90 tablet 1    Sig: TAKE 1 TABLET(10 MG) BY MOUTH DAILY     Cardiovascular: Calcium Channel Blockers 2 Passed - 09/10/2022  5:44 PM      Passed - Last BP in normal range    BP Readings from Last 1 Encounters:  08/28/22 120/82         Passed - Last Heart Rate in normal range    Pulse Readings from Last 1 Encounters:  08/28/22 66         Passed - Valid encounter within last 6 months    Recent Outpatient Visits          2 weeks ago Jaw swelling   [facility] Gwyneth Sprout, FNP   10 months ago Need for influenza vaccination   [facility] Gwyneth Sprout, FNP   1 year ago Primary hypertension   [place], Vickki Muff, PA-C   2 years ago Compulsive tobacco user syndrome   [place], PA-C   3 years ago Subacute ethmoidal sinusitis   [facility] Jerrol Banana., MD

## 2022-09-25 ENCOUNTER — Other Ambulatory Visit: Payer: Self-pay | Admitting: Family Medicine

## 2022-09-25 DIAGNOSIS — J449 Chronic obstructive pulmonary disease, unspecified: Secondary | ICD-10-CM

## 2022-09-25 NOTE — Telephone Encounter (Signed)
Requested Prescriptions  Pending Prescriptions Disp Refills   albuterol (VENTOLIN HFA) 108 (90 Base) MCG/ACT inhaler [Pharmacy Med Name: ALBUTEROL HFA INH (200 PUFFS) 6.7GM] 8 g 0    Sig: INHALE 2 PUFFS INTO THE LUNGS EVERY 6 HOURS AS NEEDED FOR WHEEZING OR SHORTNESS OF BREATH     Pulmonology:  [employer] 2 Passed - 09/25/2022  5:04 PM      Passed - Last BP in normal range    BP Readings from Last 1 Encounters:  08/28/22 120/82         Passed - Last Heart Rate in normal range    Pulse Readings from Last 1 Encounters:  08/28/22 66         Passed - Valid encounter within last 12 months    Recent Outpatient Visits           4 weeks ago Jaw swelling   [facility] Gwyneth Sprout, FNP   11 months ago Need for influenza vaccination   [facility] Gwyneth Sprout, FNP   1 year ago Primary hypertension   [place], Vickki Muff, PA-C   2 years ago Compulsive tobacco user syndrome   [place], Vickki Muff, PA-C   3 years ago Subacute ethmoidal sinusitis   [facility] Jerrol Banana., MD

## 2022-10-07 ENCOUNTER — Ambulatory Visit: Payer: BC Managed Care – PPO | Admitting: Physician Assistant

## 2022-10-07 ENCOUNTER — Encounter: Payer: Self-pay | Admitting: Physician Assistant

## 2022-10-07 VITALS — BP 104/70 | HR 67 | Temp 98.8°F | Resp 16 | Wt 126.9 lb

## 2022-10-07 DIAGNOSIS — J449 Chronic obstructive pulmonary disease, unspecified: Secondary | ICD-10-CM

## 2022-10-07 DIAGNOSIS — J329 Chronic sinusitis, unspecified: Secondary | ICD-10-CM | POA: Diagnosis not present

## 2022-10-07 NOTE — Progress Notes (Unsigned)
I,Sulibeya S Dimas,acting as a Neurosurgeon for [employer], PA-C.,have documented all relevant documentation on the behalf of Debera Lat, PA-C,as directed by  [employer], PA-C while in the presence of [employer], PA-C.     Established patient visit   Patient: Leah Shaw   DOB: 01/25/1961   61 y.o. Female  MRN: [id number] Visit Date: 10/07/2022  Today's healthcare provider: Debera Lat, PA-C   Chief Complaint  Patient presents with   URI   Subjective    HPI  Upper respiratory symptoms She complains of bilateral ear pressure/pain, congestion, facial pain, non productive cough, post nasal drip, sinus pressure, sneezing, and sore throat.with no fever, chills, night sweats or weight loss. Onset of symptoms was about a week ago and worsening.She is drinking plenty of fluids.  Past history is significant for COPD. Patient is smoker  (>1 ppd x 40 yrs) Patient reports she did a COVID test yesterday and it was negative.  Patient reports she was seen 08/28/22 and was started on ABX Levaquin. She reports symptoms never cleared completely.  ---------------------------------------------------------------------------------------------------   Medications: Outpatient Medications Prior to Visit  Medication Sig   albuterol (VENTOLIN HFA) 108 (90 Base) MCG/ACT inhaler INHALE 2 PUFFS INTO THE LUNGS EVERY 6 HOURS AS NEEDED FOR WHEEZING OR SHORTNESS OF BREATH   amLODipine (NORVASC) 10 MG tablet TAKE 1 TABLET(10 MG) BY MOUTH DAILY   aspirin 81 MG tablet Take by mouth.   cyproheptadine (PERIACTIN) 4 MG tablet Take 1 tablet (4 mg total) by mouth 3 (three) times daily as needed for allergies.   fluticasone (FLONASE) 50 MCG/ACT nasal spray Place 2 sprays into both nostrils daily.   fluticasone-salmeterol (ADVAIR) 100-50 MCG/ACT AEPB Inhale 1 puff into the lungs 2 (two) times daily.   metoprolol tartrate (LOPRESSOR) 50 MG tablet Take 1 tablet (50 mg total) by mouth 2 (two) times daily.    Multiple Vitamin tablet    simvastatin (ZOCOR) 20 MG tablet Take 1 tablet (20 mg total) by mouth daily.   Spacer/Aero-Holding Chambers DEVI 1 each by Does not apply route as needed.   No facility-administered medications prior to visit.    Review of Systems  Constitutional:  Positive for chills and fatigue. Negative for appetite change, diaphoresis and fever.  HENT:  Positive for congestion, ear pain, facial swelling, hearing loss, postnasal drip, rhinorrhea, sinus pressure, sinus pain, sneezing and sore throat.   Respiratory:  Positive for cough. Negative for chest tightness, shortness of breath and wheezing.   Cardiovascular:  Negative for chest pain.  Gastrointestinal:  Negative for abdominal pain, nausea and vomiting.    {Labs  Heme  Chem  Endocrine  Serology  Results Review (optional):23779}   Objective    BP 104/70 (BP Location: Right Arm, Patient Position: Sitting, Cuff Size: Normal)   Pulse 67   Temp 98.8 F (37.1 C) (Oral)   Resp 16   Wt 126 lb 14.4 oz (57.6 kg)   SpO2 97%   BMI 22.48 kg/m  BP Readings from Last 3 Encounters:  10/07/22 104/70  08/28/22 120/82  10/23/21 122/76   Wt Readings from Last 3 Encounters:  10/07/22 126 lb 14.4 oz (57.6 kg)  08/28/22 124 lb (56.2 kg)  10/23/21 124 lb (56.2 kg)      Physical Exam Vitals reviewed.  Constitutional:      General: She is not in acute distress.    Appearance: Normal appearance. She is well-developed. She is not diaphoretic.  HENT:     Head:  Normocephalic and atraumatic.     Ears:     Comments: Fluids behind her ears    Nose: Congestion and rhinorrhea present.     Mouth/Throat:     Pharynx: Posterior oropharyngeal erythema (mild) present.  Eyes:     General: No scleral icterus.       Right eye: Discharge present.        Left eye: Discharge present.    Conjunctiva/sclera: Conjunctivae normal.  Neck:     Thyroid: No thyromegaly.  Cardiovascular:     Rate and Rhythm: Normal rate and regular  rhythm.     Pulses: Normal pulses.     Heart sounds: Normal heart sounds. No murmur heard. Pulmonary:     Effort: Pulmonary effort is normal. No respiratory distress.     Breath sounds: Normal breath sounds. No wheezing, rhonchi or rales.  Musculoskeletal:        General: Normal range of motion.     Cervical back: Normal range of motion and neck supple.     Right lower leg: No edema.     Left lower leg: No edema.  Lymphadenopathy:     Cervical: No cervical adenopathy.  Skin:    General: Skin is warm and dry.     Findings: No rash.  Neurological:     Mental Status: She is alert and oriented to person, place, and time. Mental status is at baseline.  Psychiatric:        Mood and Affect: Mood normal.        Behavior: Behavior normal.        Thought Content: Thought content normal.        Judgment: Judgment normal.       No results found for any visits on 10/07/22.  Assessment & Plan     1. Rhinosinusitis Could be allergic or viral origin Advised :  Increase fluids.  Rest.  Saline nasal spray.   Mucinex as directed.  Humidifier in bedroom. Flonase per orders.  Call or return to clinic if symptoms are not improving. Continue to take antihistamines and inhalers she has been using for COPD control: albuterol and advair. Rx for abx will be sent to her pharmacy. Patient was instructed not to fill it unless their symptoms worsen  Discussed the potential negative side effects of antibiotics and that they can lead to resistance if used unnecessarily Discussed expectations for duration of symptoms. Discussed that she may feel bad for up to a week, but sometimes symptoms last longer (especially if she smokes)  Explained that she can get multiple viruses in a row and recommend prevention strategies such as hand washing Made a plan with the patient to outline what to do if symptoms worsen or do not improve, or if she develop concerning symptoms like high fever, SOB      No follow-ups  on file.     The patient was advised to call back or seek an in-person evaluation if the symptoms worsen or if the condition fails to improve as anticipated.  I discussed the assessment and treatment plan with the patient. The patient was provided an opportunity to ask questions and all were answered. The patient agreed with the plan and demonstrated an understanding of the instructions.  The entirety of the information documented in the History of Present Illness, Review of Systems and Physical Exam were personally obtained by me. Portions of this information were initially documented by the CMA and reviewed by me for thoroughness and accuracy.  Debera Lat, [facility], MMS [facility] [phone] (phone) [phone] (fax)

## 2022-10-08 MED ORDER — AMOXICILLIN-POT CLAVULANATE 875-125 MG PO TABS: 1.0000 | ORAL_TABLET | Freq: Two times a day (BID) | ORAL | 0 refills | Status: DC

## 2022-11-09 ENCOUNTER — Other Ambulatory Visit: Payer: Self-pay | Admitting: Family Medicine

## 2022-11-09 DIAGNOSIS — I10 Essential (primary) hypertension: Secondary | ICD-10-CM

## 2022-11-10 NOTE — Telephone Encounter (Signed)
Requested Prescriptions  Pending Prescriptions Disp Refills   amLODipine (NORVASC) 10 MG tablet [Pharmacy Med Name: AMLODIPINE BESYLATE 10MG  TABLETS] 90 tablet 0    Sig: TAKE 1 TABLET(10 MG) BY MOUTH DAILY     Cardiovascular: Calcium Channel Blockers 2 Passed - 11/09/2022  3:38 AM      Passed - Last BP in normal range    BP Readings from Last 1 Encounters:  10/07/22 104/70         Passed - Last Heart Rate in normal range    Pulse Readings from Last 1 Encounters:  10/07/22 67         Passed - Valid encounter within last 6 months    Recent Outpatient Visits           1 month ago Rhinosinusitis   [facility] [place], [place], PA-C   2 months ago Jaw swelling   [facility], FNP   1 year ago Need for influenza vaccination   [facility], FNP   1 year ago Primary hypertension   [facility] Chrism[place], [facility], PA-C   2 years ago Compulsive tobacco user syndrome   [facility] Chrismon, [facility], Jodell Cipro

## 2022-11-20 ENCOUNTER — Other Ambulatory Visit: Payer: Self-pay | Admitting: Family Medicine

## 2022-11-20 DIAGNOSIS — J449 Chronic obstructive pulmonary disease, unspecified: Secondary | ICD-10-CM

## 2022-11-20 NOTE — Telephone Encounter (Signed)
Requested Prescriptions  Pending Prescriptions Disp Refills   albuterol (VENTOLIN HFA) 108 (90 Base) MCG/ACT inhaler [Pharmacy Med Name: ALBUTEROL HFA INH (200 PUFFS) 6.7GM] 6.7 g 0    Sig: INHALE 2 PUFFS INTO THE LUNGS EVERY 6 HOURS AS NEEDED FOR WHEEZING OR SHORTNESS OF BREATH     Pulmonology:  Beta Agonists 2 Passed - 11/20/2022 10:02 AM      Passed - Last BP in normal range    BP Readings from Last 1 Encounters:  10/07/22 104/70         Passed - Last Heart Rate in normal range    Pulse Readings from Last 1 Encounters:  10/07/22 67         Passed - Valid encounter within last 12 months    Recent Outpatient Visits           1 month ago Rhinosinusitis   [facility] [place], [place], PA-C   2 months ago Jaw swelling   [facility] Jacky Kindle, FNP   1 year ago Need for influenza vaccination   [facility] Jacky Kindle, FNP   1 year ago Primary hypertension   [facility] Chrismon, Jodell Cipro, PA-C   2 years ago Compulsive tobacco user syndrome   [facility] Chrismon, [place]od[place] Cipro, [place]

## 2022-12-16 ENCOUNTER — Other Ambulatory Visit: Payer: Self-pay | Admitting: Family Medicine

## 2022-12-16 DIAGNOSIS — I10 Essential (primary) hypertension: Secondary | ICD-10-CM

## 2023-03-16 ENCOUNTER — Other Ambulatory Visit: Payer: Self-pay | Admitting: Family Medicine

## 2023-03-16 DIAGNOSIS — I10 Essential (primary) hypertension: Secondary | ICD-10-CM

## 2023-03-16 NOTE — Telephone Encounter (Signed)
Requested Prescriptions  Pending Prescriptions Disp Refills   metoprolol tartrate (LOPRESSOR) 50 MG tablet [Pharmacy Med Name: METOPROLOL TARTRATE  TABLETS] 180 tablet 0    Sig: TAKE 1 TABLET(50 MG) BY MOUTH TWICE DAILY     Cardiovascular:  Beta Blockers Passed - 03/16/2023  6:19 AM      Passed - Last BP in normal range    BP Readings from Last 1 Encounters:  10/07/22 104/70         Passed - Last Heart Rate in normal range    Pulse Readings from Last 1 Encounters:  10/07/22 67         Passed - Valid encounter within last 6 months    Recent Outpatient Visits           5 months ago Rhinosinusitis   [facility] [place], [place], PA-C   6 months ago Jaw swelling   [facility] Merita Norton T, FNP   1 year ago Need for influenza vaccination   [facility] Jacky Kindle, FNP   1 year ago Primary hypertension   [facility] Chrismon, [place]od[place] Cipro, [place]   2 years ago Compulsive tobacco user syndrome   [facility] Chrismon, Jodell Cipro, [place]

## 2023-04-09 NOTE — Progress Notes (Signed)
[facility] Quality Team Note  Name: Leah Shaw Date of Birth: 03-19-1961 MRN: [id number] Date: 04/09/2023  [facility] Quality Team has reviewed this patient's chart, please see recommendations below:  [facility] Quality Other; (PATIENT DUE FOR BREAST CANCER SCREENING, BLOOD PRESSURE READING (LESS THAN 140/90), AND COLON CANCER SCREENING.)

## 2023-04-15 ENCOUNTER — Other Ambulatory Visit: Payer: Self-pay | Admitting: Family Medicine

## 2023-04-15 DIAGNOSIS — I10 Essential (primary) hypertension: Secondary | ICD-10-CM

## 2023-10-06 ENCOUNTER — Other Ambulatory Visit: Payer: Self-pay | Admitting: Family Medicine

## 2023-10-06 DIAGNOSIS — I10 Essential (primary) hypertension: Secondary | ICD-10-CM

## 2023-10-07 NOTE — Telephone Encounter (Signed)
Requested medications are due for refill today.  yes  Requested medications are on the active medications list.  yes  Last refill. 11/10/2022 #90 0 rf  Future visit scheduled.   no  Notes to clinic.  Pt is more than 3 months overdue for an office visit.    Requested Prescriptions  Pending Prescriptions Disp Refills   amLODipine (NORVASC) 10 MG tablet [Pharmacy Med Name: AMLODIPINE BESYLATE 10MG  TABLETS] 90 tablet 0    Sig: TAKE 1 TABLET(10 MG) BY MOUTH DAILY     Cardiovascular: Calcium Channel Blockers 2 Failed - 10/06/2023  4:53 PM      Failed - Valid encounter within last 6 months    Recent Outpatient Visits           1 year ago Rhinosinusitis   [facility] [place], [place], PA-C   1 year ago Jaw swelling   [facility] Merita Norton T, FNP   1 year ago Need for influenza vaccination   [facility] Jacky Kindle, FNP   2 years ago Primary hypertension   [facility] [place]ism[place], Jod[place] Cipro, PA-C   3 years ago Compulsive tobacco user syndrome    [facility] Chrism[place], [place] Cipro, PA-C              Passed - Last BP in normal range    BP Readings from Last 1 Encounters:  10/07/22 104/70         Passed - Last Heart Rate in normal range    Pulse Readings from Last 1 Encounters:  10/07/22 67

## 2023-10-12 ENCOUNTER — Telehealth: Payer: Self-pay | Admitting: Family Medicine

## 2023-10-12 NOTE — Telephone Encounter (Signed)
Patient called to f/u on her med refill for metoprolol tartrate (LOPRESSOR) 50 MG tablet and amLODipine (NORVASC) 10 MG tablet. Adv she would need appt before provider would send in for refill. Patient sttd she does not have insurance and can't afford a office visit at the time but need her medication now. Please f/u with patient

## 2023-10-13 NOTE — Telephone Encounter (Signed)
Appt made with Leah Shaw for 10/14/23. Pt advised.

## 2023-10-14 ENCOUNTER — Encounter: Payer: Self-pay | Admitting: Physician Assistant

## 2023-10-14 ENCOUNTER — Ambulatory Visit: Payer: Self-pay | Admitting: Physician Assistant

## 2023-10-14 VITALS — BP 144/96 | HR 66 | Resp 16 | Ht 63.0 in | Wt 126.5 lb

## 2023-10-14 DIAGNOSIS — I10 Essential (primary) hypertension: Secondary | ICD-10-CM

## 2023-10-14 DIAGNOSIS — J449 Chronic obstructive pulmonary disease, unspecified: Secondary | ICD-10-CM

## 2023-10-14 MED ORDER — ALBUTEROL SULFATE HFA 108 (90 BASE) MCG/ACT IN AERS: 2.0000 | INHALATION_SPRAY | Freq: Four times a day (QID) | RESPIRATORY_TRACT | 0 refills | Status: DC | PRN

## 2023-10-14 MED ORDER — AMLODIPINE BESYLATE 10 MG PO TABS: 10.0000 mg | ORAL_TABLET | Freq: Every day | ORAL | 0 refills | Status: DC

## 2023-10-14 MED ORDER — METOPROLOL TARTRATE 50 MG PO TABS: 50.0000 mg | ORAL_TABLET | Freq: Two times a day (BID) | ORAL | 0 refills | Status: DC

## 2023-10-14 NOTE — Assessment & Plan Note (Signed)
Chronic, historic condition Appears mildly exacerbated today due to being out of amlodipine Will provide 60-day supplies of amlodipine and metoprolol until she can schedule appointment with her PCP for routine follow-up and labs once she gets insurance at the first of the year Recommend that she continues with amlodipine 10 mg p.o. daily, metoprolol 50 mg p.o. twice daily Follow-up with PCP  in January

## 2023-10-14 NOTE — Assessment & Plan Note (Signed)
Chronic, historic condition Appears overall stable She reports that she uses her albuterol inhaler more so than maintenance but she has run out Will provide refill for albuterol while she is waiting to see PCP for chronic management Patient is amenable to only getting rescue inhaler today as she is concerned about cost of maintenance inhalers Follow-up as needed for progressing or persistent symptoms.  Recommend she follows up with PCP in January after she gets insurance

## 2023-10-14 NOTE — Patient Instructions (Signed)
Please schedule an apt with your PCP after the first of the year to get your regular follow up and make sure you have your medications in order You will likely also need lab work at that apt so try to fast for at least 8 hours prior to your apt. Please let us know if you have questions or concerns prior to your next apt   It was nice to meet you and I appreciate the opportunity to be involved in your care If you were satisfied with the care you received from me, I would greatly appreciate you saying so in the after-visit survey that is sent out following our visit.

## 2023-10-14 NOTE — Progress Notes (Signed)
Acute Office Visit   Patient: Leah Shaw   DOB: [birth year]   62 y.o. Female  MRN: [id number] Visit Date: 10/14/2023  Today's healthcare provider: Oswaldo Conroy Dalilah Curlin, PA-C  Introduced myself to the patient as a Secondary school teacher and provided education on APPs in clinical practice.    Chief Complaint  Patient presents with   Hypertension   Subjective    HPI   Hypertension: - Medications: amlodipine 10 mg PO every day (she has run out of this), metoprolol  - Compliance: good when she has medications  - Checking BP at home: she is not checking at home   She reports significant stressors at home- her sister had a hemorrhagic stroke and she is providing care She is divorcing her husband and thus lost her health insurance- she reports she will have new insurance at the first of the year   Medications: Outpatient Medications Prior to Visit  Medication Sig   aspirin 81 MG tablet Take by mouth.   Multiple Vitamin tablet    [DISCONTINUED] metoprolol tartrate (LOPRESSOR) 50 MG tablet TAKE 1 TABLET(50 MG) BY MOUTH TWICE DAILY   cyproheptadine (PERIACTIN) 4 MG tablet Take 1 tablet (4 mg total) by mouth 3 (three) times daily as needed for allergies. (Patient not taking: Reported on 10/14/2023)   fluticasone (FLONASE) 50 MCG/ACT nasal spray Place 2 sprays into both nostrils daily. (Patient not taking: Reported on 10/14/2023)   fluticasone-salmeterol (ADVAIR) 100-50 MCG/ACT AEPB Inhale 1 puff into the lungs 2 (two) times daily. (Patient not taking: Reported on 10/14/2023)   simvastatin (ZOCOR) 20 MG tablet Take 1 tablet (20 mg total) by mouth daily. (Patient not taking: Reported on 10/14/2023)   Spacer/Aero-Holding Chambers DEVI 1 each by Does not apply route as needed. (Patient not taking: Reported on 10/14/2023)   [DISCONTINUED] albuterol (VENTOLIN HFA) 108 (90 Base) MCG/ACT inhaler INHALE 2 PUFFS INTO THE LUNGS EVERY 6 HOURS AS NEEDED FOR WHEEZING OR SHORTNESS OF BREATH (Patient not taking:  Reported on 10/14/2023)   [DISCONTINUED] amLODipine (NORVASC) 10 MG tablet TAKE 1 TABLET(10 MG) BY MOUTH DAILY (Patient not taking: Reported on 10/14/2023)   [DISCONTINUED] amoxicillin-clavulanate (AUGMENTIN) 875-125 MG tablet Take 1 tablet by mouth 2 (two) times daily. (Patient not taking: Reported on 10/14/2023)   No facility-administered medications prior to visit.    Review of Systems  Eyes:  Negative for visual disturbance.  Respiratory:  Negative for chest tightness, shortness of breath and wheezing.   Cardiovascular:  Negative for chest pain, palpitations and leg swelling.  Neurological:  Positive for headaches. Negative for dizziness and light-headedness.        Objective    BP (!) 144/96   Pulse 66   Resp 16   Ht 5\' 3"  (1.6 m)   Wt 126 lb 8 oz (57.4 kg)   SpO2 95%   BMI 22.41 kg/m     Physical Exam Vitals reviewed.  Constitutional:      General: She is awake.     Appearance: Normal appearance. She is well-developed and well-groomed.  HENT:     Head: Normocephalic and atraumatic.  Cardiovascular:     Rate and Rhythm: Normal rate and regular rhythm.     Pulses: Normal pulses.          Radial pulses are 2+ on the right side and 2+ on the left side.     Heart sounds: Normal heart sounds. No murmur heard.    No friction rub.  No gallop.  Pulmonary:     Effort: Pulmonary effort is normal.     Breath sounds: Normal breath sounds. No decreased air movement. No decreased breath sounds, wheezing, rhonchi or rales.  Musculoskeletal:     Cervical back: Normal range of motion.     Right lower leg: No edema.     Left lower leg: No edema.  Neurological:     General: No focal deficit present.     Mental Status: She is alert and oriented to person, place, and time. Mental status is at baseline.     GCS: GCS eye subscore is 4. GCS verbal subscore is 5. GCS motor subscore is 6.  Psychiatric:        Attention and Perception: Attention and perception normal.        Mood and  Affect: Mood and affect normal.        Speech: Speech normal.        Behavior: Behavior normal. Behavior is cooperative.        Thought Content: Thought content normal.        Cognition and Memory: Cognition normal.       No results found for any visits on 10/14/23.  Assessment & Plan      No follow-ups on file.      Problem List Items Addressed This Visit       Cardiovascular and Mediastinum   BP (high blood pressure) - Primary    Chronic, historic condition Appears mildly exacerbated today due to being out of amlodipine Will provide 60-day supplies of amlodipine and metoprolol until she can schedule appointment with her PCP for routine follow-up and labs once she gets insurance at the first of the year Recommend that she continues with amlodipine 10 mg p.o. daily, metoprolol 50 mg p.o. twice daily Follow-up with PCP  in January       Relevant Medications   metoprolol tartrate (LOPRESSOR) 50 MG tablet   amLODipine (NORVASC) 10 MG tablet     Respiratory   Chronic obstructive pulmonary disease (HCC)    Chronic, historic condition Appears overall stable She reports that she uses her albuterol inhaler more so than maintenance but she has run out Will provide refill for albuterol while she is waiting to see PCP for chronic management Patient is amenable to only getting rescue inhaler today as she is concerned about cost of maintenance inhalers Follow-up as needed for progressing or persistent symptoms.  Recommend she follows up with PCP in January after she gets insurance      Relevant Medications   albuterol (VENTOLIN HFA) 108 (90 Base) MCG/ACT inhaler     No follow-ups on file.   I, Josemanuel Eakins E Remee Charley, PA-C, have reviewed all documentation for this visit. The documentation on 10/14/23 for the exam, diagnosis, procedures, and orders are all accurate and complete.   Jacquelin Hawking, MHS, PA-C [facility]

## 2023-11-29 ENCOUNTER — Encounter: Payer: Self-pay | Admitting: Family Medicine

## 2023-11-29 ENCOUNTER — Ambulatory Visit (INDEPENDENT_AMBULATORY_CARE_PROVIDER_SITE_OTHER): Payer: Self-pay | Admitting: Family Medicine

## 2023-11-29 VITALS — BP 136/84 | HR 80 | Resp 18 | Ht 63.0 in | Wt 124.9 lb

## 2023-11-29 DIAGNOSIS — I10 Essential (primary) hypertension: Secondary | ICD-10-CM

## 2023-11-29 DIAGNOSIS — Z13820 Encounter for screening for osteoporosis: Secondary | ICD-10-CM

## 2023-11-29 DIAGNOSIS — J329 Chronic sinusitis, unspecified: Secondary | ICD-10-CM

## 2023-11-29 DIAGNOSIS — F17200 Nicotine dependence, unspecified, uncomplicated: Secondary | ICD-10-CM

## 2023-11-29 DIAGNOSIS — Z0000 Encounter for general adult medical examination without abnormal findings: Secondary | ICD-10-CM

## 2023-11-29 DIAGNOSIS — Z5320 Procedure and treatment not carried out because of patient's decision for unspecified reasons: Secondary | ICD-10-CM | POA: Insufficient documentation

## 2023-11-29 DIAGNOSIS — E7800 Pure hypercholesterolemia, unspecified: Secondary | ICD-10-CM | POA: Insufficient documentation

## 2023-11-29 DIAGNOSIS — Z0001 Encounter for general adult medical examination with abnormal findings: Secondary | ICD-10-CM

## 2023-11-29 DIAGNOSIS — Z1211 Encounter for screening for malignant neoplasm of colon: Secondary | ICD-10-CM

## 2023-11-29 DIAGNOSIS — Z636 Dependent relative needing care at home: Secondary | ICD-10-CM | POA: Insufficient documentation

## 2023-11-29 DIAGNOSIS — Z1231 Encounter for screening mammogram for malignant neoplasm of breast: Secondary | ICD-10-CM

## 2023-11-29 DIAGNOSIS — J449 Chronic obstructive pulmonary disease, unspecified: Secondary | ICD-10-CM

## 2023-11-29 DIAGNOSIS — E782 Mixed hyperlipidemia: Secondary | ICD-10-CM

## 2023-11-29 MED ORDER — ALBUTEROL SULFATE HFA 108 (90 BASE) MCG/ACT IN AERS: 2.0000 | INHALATION_SPRAY | Freq: Four times a day (QID) | RESPIRATORY_TRACT | 3 refills | Status: DC | PRN

## 2023-11-29 MED ORDER — AMLODIPINE BESYLATE 10 MG PO TABS: 10.0000 mg | ORAL_TABLET | Freq: Every day | ORAL | 3 refills | Status: DC

## 2023-11-29 MED ORDER — METOPROLOL TARTRATE 50 MG PO TABS: 50.0000 mg | ORAL_TABLET | Freq: Two times a day (BID) | ORAL | 4 refills | Status: DC

## 2023-11-29 MED ORDER — SIMVASTATIN 20 MG PO TABS: 20.0000 mg | ORAL_TABLET | Freq: Every day | ORAL | 3 refills | Status: DC

## 2023-11-29 MED ORDER — FLUTICASONE-SALMETEROL 100-50 MCG/ACT IN AEPB
1.0000 | INHALATION_SPRAY | Freq: Two times a day (BID) | RESPIRATORY_TRACT | 3 refills | Status: DC
Start: 2023-11-29 — End: 2023-12-01

## 2023-11-29 MED ORDER — CYPROHEPTADINE HCL 4 MG PO TABS: 4.0000 mg | ORAL_TABLET | Freq: Three times a day (TID) | ORAL | 3 refills | Status: DC | PRN

## 2023-11-29 NOTE — Assessment & Plan Note (Signed)
 Continue daily cyproheptadine for allergy needs

## 2023-11-29 NOTE — Assessment & Plan Note (Addendum)
 Continue daily simvastatin 20mg  - will check lipid panel  - stressed smoking cessation

## 2023-11-29 NOTE — Assessment & Plan Note (Signed)
 Chronic tobacco use Pt decline lung screening given caregiver strain and time Will reassess next visit.

## 2023-11-29 NOTE — Progress Notes (Signed)
 Complete physical exam  Patient: Leah Shaw   DOB: 08/24/1961   63 y.o. Female  MRN: [id number]  Introduced to nurse practitioner role and practice setting.  All questions answered.  Discussed provider/patient relationship and expectations.   Subjective:    Chief Complaint  Patient presents with   Annual Exam    Leah Shaw is a 63 y.o. female who presents today for a complete physical exam. She reports consuming a general diet. The patient does not participate in regular exercise at present. She generally feels fairly well. She reports sleeping fairly well. She does have additional problems to discuss today.   Would like refills on her chronic medications today.  Sister had hemorrhagic stroke in August - increased caregiver strain. 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 Increased stress and less time for self-care.   Denies recent appt with vision or dental. Wears readers.   Due for mammogram screening, colon ca screening, and osteoporosis screening.    Most recent fall risk assessment:    11/29/2023   10:35 AM  Fall Risk   Falls in the past year? 0  Number falls in past yr: 0  Injury with Fall? 0     Most recent depression screenings:    11/29/2023   10:34 AM 10/14/2023    8:40 AM  PHQ 2/9 Scores  PHQ - 2 Score 0 0  PHQ- 9 Score 1 0      Patient Active Problem List   Diagnosis Date Noted   Annual physical exam 11/29/2023   Lung cancer screening declined by patient 11/29/2023   Caregiver burden 11/29/2023   Recurrent sinusitis 08/28/2022   Jaw swelling 08/28/2022   Chronic obstructive pulmonary disease (HCC) 08/28/2022   Hyperlipidemia 11/30/2017   Herpes zona 12/19/2015   History of endocrine, metabolic or immunity disorder 98/73/7982   BP (high blood pressure) 12/19/2015   Carpal tunnel syndrome 02/20/2010   Compulsive tobacco user syndrome 12/11/2008   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   Past Medical History:  Diagnosis Date   Allergy     Hypertension    Past Surgical History:  Procedure Laterality Date   ABDOMINAL HYSTERECTOMY  2009   TUBAL LIGATION  1997   Social History   Tobacco Use   Smoking status: Every Day    Current packs/day: 1.00    Average packs/day: 1 pack/day for 30.0 years (30.0 ttl pk-yrs)    Types: Cigarettes   Smokeless tobacco: Never  Vaping Use   Vaping status: Never Used  Substance Use Topics   Alcohol use: No    Alcohol/week: 0.0 standard drinks of alcohol   Drug use: No   Allergies  Allergen Reactions   Sulfa Antibiotics Swelling    swelling face and tongue   Fenofibrate  Other (See Comments)    Foot pains      Patient Care Team: Wellington Curtis LABOR, FNP as PCP - General (Family Medicine)   Outpatient Medications Prior to Visit  Medication Sig   aspirin 81 MG tablet Take by mouth.   Multiple Vitamin tablet    [DISCONTINUED] albuterol  (VENTOLIN  HFA) 108 (90 Base) MCG/ACT inhaler Inhale 2 puffs into the lungs every 6 (six) hours as needed for wheezing or shortness of breath.   [DISCONTINUED] amLODipine  (NORVASC ) 10 MG tablet Take 1 tablet (10 mg total) by mouth daily.   [DISCONTINUED] cyproheptadine  (PERIACTIN ) 4 MG tablet Take 1 tablet (4 mg total) by mouth 3 (three) times daily as needed for allergies.   [DISCONTINUED] fluticasone  (  FLONASE ) 50 MCG/ACT nasal spray Place 2 sprays into both nostrils daily.   [DISCONTINUED] fluticasone -salmeterol (ADVAIR) 100-50 MCG/ACT AEPB Inhale 1 puff into the lungs 2 (two) times daily.   [DISCONTINUED] metoprolol  tartrate (LOPRESSOR ) 50 MG tablet Take 1 tablet (50 mg total) by mouth 2 (two) times daily.   [DISCONTINUED] simvastatin  (ZOCOR ) 20 MG tablet Take 1 tablet (20 mg total) by mouth daily.   [DISCONTINUED] Spacer/Aero-Holding Raguel DEVI 1 each by Does not apply route as needed. (Patient not taking: Reported on 10/14/2023)   No facility-administered medications prior to visit.    Review of Systems  All other systems reviewed and are  negative.     Objective:     BP 136/84   Pulse 80   Resp 18   Ht 5' 3 (1.6 m)   Wt 124 lb 14.4 oz (56.7 kg)   SpO2 91%   BMI 22.13 kg/m  BP Readings from Last 3 Encounters:  11/29/23 136/84  10/14/23 (!) 144/96  10/07/22 104/70   Wt Readings from Last 3 Encounters:  11/29/23 124 lb 14.4 oz (56.7 kg)  10/14/23 126 lb 8 oz (57.4 kg)  10/07/22 126 lb 14.4 oz (57.6 kg)      Physical Exam Vitals reviewed.  Constitutional:      General: She is not in acute distress.    Appearance: Normal appearance. She is normal weight. She is not ill-appearing, toxic-appearing or diaphoretic.  HENT:     Head: Normocephalic.     Right Ear: Tympanic membrane normal.     Left Ear: Tympanic membrane normal.     Nose: Nose normal.     Mouth/Throat:     Mouth: Mucous membranes are moist.     Pharynx: Oropharynx is clear. No oropharyngeal exudate or posterior oropharyngeal erythema.  Eyes:     Extraocular Movements: Extraocular movements intact.     Conjunctiva/sclera: Conjunctivae normal.     Pupils: Pupils are equal, round, and reactive to light.  Neck:     Thyroid : No thyroid  mass, thyromegaly or thyroid  tenderness.  Cardiovascular:     Rate and Rhythm: Normal rate.     Pulses: Normal pulses.     Heart sounds: Normal heart sounds. No murmur heard.    No friction rub. No gallop.  Pulmonary:     Effort: Pulmonary effort is normal. No respiratory distress.     Breath sounds: Normal breath sounds. No stridor. No wheezing, rhonchi or rales.  Abdominal:     General: Bowel sounds are normal. There is no distension.     Palpations: Abdomen is soft. There is no mass.     Tenderness: There is no abdominal tenderness. There is no right CVA tenderness, left CVA tenderness, guarding or rebound.     Hernia: No hernia is present.  Musculoskeletal:        General: No swelling or tenderness. Normal range of motion.     Cervical back: Normal range of motion. No rigidity.  Lymphadenopathy:      Cervical: No cervical adenopathy.  Skin:    General: Skin is warm and dry.     Capillary Refill: Capillary refill takes less than 2 seconds.     Findings: No bruising or erythema.  Neurological:     General: No focal deficit present.     Mental Status: She is alert and oriented to person, place, and time. Mental status is at baseline.     Cranial Nerves: No cranial nerve deficit.     Sensory: No  sensory deficit.  Psychiatric:        Mood and Affect: Mood normal.        Behavior: Behavior normal.        Thought Content: Thought content normal.        Judgment: Judgment normal.      No results found for any visits on 11/29/23.     Assessment & Plan:    Routine Health Maintenance and Physical Exam  Health Maintenance  Topic Date Due   DEXA scan (bone density measurement)  Never done   Colon Cancer Screening  Never done   Screening for Lung Cancer  Never done   Zoster (Shingles) Vaccine (1 of 2) Never done   Stool Blood Test  08/26/2018   Pap with HPV screening  08/26/2020   Mammogram  11/02/2020   Flu Shot  06/24/2023   COVID-19 Vaccine (1 - 2024-25 season) Never done   DTaP/Tdap/Td vaccine (2 - Td or Tdap) 08/27/2027   Hepatitis C Screening  Completed   HIV Screening  Completed   HPV Vaccine  Aged Out    Discussed health benefits of physical activity, and encouraged her to engage in regular exercise appropriate for her age and condition.  Annual physical exam Assessment & Plan: Annual physical today - cologuard, mammo screening, and dexa screening ordered - routine labs - medication refills on HTN, cholesterol, COPD, and sinusitis meds.   Primary hypertension Assessment & Plan: Chronic, mildly elevated today SBP 136; DBP 84 - Monitor at home, goal sbp<130; dbp<80 - low sodium diet, exercise, tobacco cessation, and increase water intake - continue daily meds - amlodipine  10mg  daily and metoprolol  50mg  BID   Orders: -     Comprehensive metabolic panel -      Lipid panel -     amLODIPine  Besylate; Take 1 tablet (10 mg total) by mouth daily.  Dispense: 60 tablet; Refill: 3 -     Metoprolol  Tartrate; Take 1 tablet (50 mg total) by mouth 2 (two) times daily.  Dispense: 60 tablet; Refill: 4  Chronic obstructive pulmonary disease, unspecified COPD type (HCC) Assessment & Plan: Chronic stable - reordered rescue inhaler and daily maintenance inhaler Sp02 today= 91%, breath sounds clear, not in distress today.  - Tobacco cessation stressed - not ready    Orders: -     CBC with Differential/Platelet -     Albuterol  Sulfate HFA; Inhale 2 puffs into the lungs every 6 (six) hours as needed for wheezing or shortness of breath.  Dispense: 6.7 g; Refill: 3 -     Fluticasone -Salmeterol; Inhale 1 puff into the lungs 2 (two) times daily.  Dispense: 1 each; Refill: 3  Recurrent sinusitis Assessment & Plan: Continue daily cyproheptadine  for allergy needs  Orders: -     Cyproheptadine  HCl; Take 1 tablet (4 mg total) by mouth 3 (three) times daily as needed for allergies.  Dispense: 270 tablet; Refill: 3  Colon cancer screening -     Cologuard  Encounter for screening mammogram for malignant neoplasm of breast -     3D Screening Mammogram, Left and Right; Future  Osteoporosis screening -     DG Bone Density; Future  Lung cancer screening declined by patient Assessment & Plan: Chronic tobacco use Pt decline lung screening given caregiver strain and time Will reassess next visit.   Compulsive tobacco user syndrome Assessment & Plan: Not ready to quit Has tried gums, lozenges, wellbutrin  and chantix prior Addressed the tobacco cessation referral process as well  as 1 800 QUIT NOW and pt continued to have hesitation   Continue to discuss at each OV/CPE      Mixed hyperlipidemia Assessment & Plan: Continue daily simvastatin  20mg  - will check lipid panel  - stressed smoking cessation  Orders: -     Lipid panel -     Simvastatin ; Take 1  tablet (20 mg total) by mouth daily.  Dispense: 90 tablet; Refill: 3  Caregiver burden Assessment & Plan: Caring for sister post hemorrhagic stroke, nearly total care. Pt's children try to help, but she is limited. Needing extra assistance, affecting her daily life and own self care.  Orders: -     AMB Referral [facility] Care Management    Will communicate lab results  Return in about 6 months (around 05/28/2024) for BP check.    I, Curtis DELENA Boom, FNP, have reviewed all documentation for this visit. The documentation on 11/29/23 for the exam, diagnosis, procedures, and orders are all accurate and complete.   Curtis DELENA Boom, FNP

## 2023-11-29 NOTE — Assessment & Plan Note (Signed)
 Annual physical today - cologuard, mammo screening, and dexa screening ordered - routine labs - medication refills on HTN, cholesterol, COPD, and sinusitis meds.

## 2023-11-29 NOTE — Assessment & Plan Note (Addendum)
 Chronic, mildly elevated today SBP 136; DBP 84 - Monitor at home, goal sbp<130; dbp<80 - low sodium diet, exercise, tobacco cessation, and increase water intake - continue daily meds - amlodipine 10mg  daily and metoprolol 50mg  BID

## 2023-11-29 NOTE — Assessment & Plan Note (Signed)
 Chronic stable - reordered rescue inhaler and daily maintenance inhaler Sp02 today= 91%, breath sounds clear, not in distress today.  - Tobacco cessation stressed - not ready

## 2023-11-29 NOTE — Assessment & Plan Note (Signed)
 Caring for sister post hemorrhagic stroke, nearly total care. Pt's children try to help, but she is limited. Needing extra assistance, affecting her daily life and own self care.

## 2023-11-29 NOTE — Assessment & Plan Note (Signed)
Not ready to quit Has tried gums, lozenges, wellbutrin and chantix prior Addressed the tobacco cessation referral process as well as 1 800 QUIT NOW and pt continued to have hesitation  Continue to discuss at each OV/CPE

## 2023-11-30 ENCOUNTER — Telehealth: Payer: Self-pay | Admitting: *Deleted

## 2023-11-30 ENCOUNTER — Other Ambulatory Visit: Payer: Self-pay | Admitting: Family Medicine

## 2023-11-30 DIAGNOSIS — E782 Mixed hyperlipidemia: Secondary | ICD-10-CM

## 2023-11-30 LAB — CBC WITH DIFFERENTIAL/PLATELET
Basophils Absolute: 0 10*3/uL (ref 0.0–0.2)
Basos: 1 %
EOS (ABSOLUTE): 0.1 10*3/uL (ref 0.0–0.4)
Eos: 1 %
Hematocrit: 42.8 % (ref 34.0–46.6)
Hemoglobin: 14.5 g/dL (ref 11.1–15.9)
Immature Grans (Abs): 0 10*3/uL (ref 0.0–0.1)
Immature Granulocytes: 0 %
Lymphocytes Absolute: 2.3 10*3/uL (ref 0.7–3.1)
Lymphs: 26 %
MCH: 32.2 pg (ref 26.6–33.0)
MCHC: 33.9 g/dL (ref 31.5–35.7)
MCV: 95 fL (ref 79–97)
Monocytes Absolute: 0.5 10*3/uL (ref 0.1–0.9)
Monocytes: 6 %
Neutrophils Absolute: 5.8 10*3/uL (ref 1.4–7.0)
Neutrophils: 66 %
Platelets: 254 10*3/uL (ref 150–450)
RBC: 4.51 x10E6/uL (ref 3.77–5.28)
RDW: 11.5 % — ABNORMAL LOW (ref 11.7–15.4)
WBC: 8.8 10*3/uL (ref 3.4–10.8)

## 2023-11-30 LAB — LIPID PANEL
Chol/HDL Ratio: 3.6 {ratio} (ref 0.0–4.4)
Cholesterol, Total: 217 mg/dL — ABNORMAL HIGH (ref 100–199)
HDL: 60 mg/dL (ref >39)
LDL Chol Calc (NIH): 132 mg/dL — ABNORMAL HIGH (ref 0–99)
Triglycerides: 139 mg/dL (ref 0–149)
VLDL Cholesterol Cal: 25 mg/dL (ref 5–40)

## 2023-11-30 LAB — COMPREHENSIVE METABOLIC PANEL
ALT: 17 [IU]/L (ref 0–32)
AST: 19 [IU]/L (ref 0–40)
Albumin: 4.6 g/dL (ref 3.8–4.9)
Alkaline Phosphatase: 95 [IU]/L (ref 44–121)
BUN/Creatinine Ratio: 19 (ref 12–28)
BUN: 18 mg/dL (ref 8–27)
Bilirubin Total: 0.4 mg/dL (ref 0.0–1.2)
CO2: 20 mmol/L (ref 20–29)
Calcium: 10.1 mg/dL (ref 8.7–10.3)
Chloride: 104 mmol/L (ref 96–106)
Creatinine, Ser: 0.97 mg/dL (ref 0.57–1.00)
Globulin, Total: 2.4 g/dL (ref 1.5–4.5)
Glucose: 83 mg/dL (ref 70–99)
Potassium: 4.1 mmol/L (ref 3.5–5.2)
Sodium: 142 mmol/L (ref 134–144)
Total Protein: 7 g/dL (ref 6.0–8.5)
eGFR: 67 mL/min/{1.73_m2} (ref >59)

## 2023-11-30 MED ORDER — ROSUVASTATIN CALCIUM 20 MG PO TABS: 20.0000 mg | ORAL_TABLET | Freq: Every day | ORAL | 1 refills | Status: DC

## 2023-11-30 NOTE — Progress Notes (Signed)
 Complex Care Management Note Care Guide Note  11/30/2023 Name: Leah Shaw MRN: [id number] DOB: May 22, 1961   Complex Care Management Outreach Attempts: An unsuccessful telephone outreach was attempted today to offer the patient information about available complex care management services.  Follow Up Plan:  Additional outreach attempts will be made to offer the patient complex care management information and services.   Encounter Outcome:  No Answer  Thedford Franks, CCMA Care Coordination Care Guide Direct Dial: [phone]

## 2023-12-01 ENCOUNTER — Telehealth: Payer: Self-pay | Admitting: Family Medicine

## 2023-12-01 ENCOUNTER — Other Ambulatory Visit: Payer: Self-pay | Admitting: Family Medicine

## 2023-12-01 NOTE — Telephone Encounter (Signed)
 [facility] pharmacy is requesting drug change request ADVAIR DISKUS 100/50MCG (GREEN) 60S Please advise

## 2023-12-01 NOTE — Progress Notes (Signed)
 Complex Care Management Note Care Guide Note  12/01/2023 Name: Leah Shaw MRN: [id number] DOB: Dec 24, 1960   Complex Care Management Outreach Attempts: A second unsuccessful outreach was attempted today to offer the patient with information about available complex care management services.  Follow Up Plan:  Additional outreach attempts will be made to offer the patient complex care management information and services.   Encounter Outcome:  No Answer  Thedford Franks, CCMA Care Coordination Care Guide Direct Dial: [phone]

## 2023-12-02 ENCOUNTER — Telehealth: Payer: Self-pay | Admitting: Family Medicine

## 2023-12-02 ENCOUNTER — Other Ambulatory Visit: Payer: Self-pay | Admitting: Family Medicine

## 2023-12-02 DIAGNOSIS — J449 Chronic obstructive pulmonary disease, unspecified: Secondary | ICD-10-CM

## 2023-12-02 MED ORDER — FLUTICASONE-SALMETEROL 100-50 MCG/ACT IN AEPB: 1.0000 | INHALATION_SPRAY | Freq: Two times a day (BID) | RESPIRATORY_TRACT | 6 refills | Status: DC

## 2023-12-02 NOTE — Progress Notes (Signed)
 Complex Care Management Note Care Guide Note  12/02/2023 Name: ADANYA SOSINSKI MRN: [id number] DOB: 04-21-1961   Complex Care Management Outreach Attempts: A third unsuccessful outreach was attempted today to offer the patient with information about available complex care management services.  Follow Up Plan:  No further outreach attempts will be made at this time. We have been unable to contact the patient to offer or enroll patient in complex care management services.  Encounter Outcome:  No Answer  Thedford Franks, CCMA Care Coordination Care Guide Direct Dial: [phone]

## 2023-12-02 NOTE — Telephone Encounter (Signed)
 PA submitted in Cover My Meds for Fluticasone-Salmeterol 100-50 mcg/ACT   Berton Lan (Key: [id number]) PA Case ID #: [id number] Rx #: [id number]

## 2023-12-02 NOTE — Telephone Encounter (Signed)
 [facility] pharmacy is requesting prior authorization Key: [id number] Fluticasone- Salmeterol 100-50 MCG/ ACT Aerosol Powder

## 2023-12-03 NOTE — Telephone Encounter (Signed)
 Leah Shaw (Key: [id number]) PA Case ID #: [id number] Rx #: [id number]  Outcome  APPROVED today by [facility] 2017 NCPDP  Request Reference Number: [id number]. FLUTIC/SALME AER 100/50 is approved through 12/01/2024. Your patient may now fill this prescription and it will be covered.  Authorization Expiration Date: 12/01/2024  Drug Fluticasone -Salmeterol 100-50MCG/ACT aerosol powder  Form [facility] Electronic Prior Authorization Form (2017 NCPDP) Original Claim Info 75 Use ADVAIR HFA, BREO ELLIPTA, [email or web address] [employer] member call [phone] 2 fills, must fill 90DS at [facility] or [facility], or pay full costDrug Requires Prior Authorization

## 2023-12-22 ENCOUNTER — Other Ambulatory Visit: Payer: Self-pay | Admitting: Family Medicine

## 2023-12-22 ENCOUNTER — Telehealth: Payer: Self-pay

## 2023-12-22 ENCOUNTER — Other Ambulatory Visit: Payer: Self-pay

## 2023-12-22 ENCOUNTER — Encounter: Payer: Self-pay | Admitting: Family Medicine

## 2023-12-22 DIAGNOSIS — Z1211 Encounter for screening for malignant neoplasm of colon: Secondary | ICD-10-CM

## 2023-12-22 DIAGNOSIS — R195 Other fecal abnormalities: Secondary | ICD-10-CM

## 2023-12-22 LAB — COLOGUARD: COLOGUARD: POSITIVE — AB

## 2023-12-22 MED ORDER — NA SULFATE-K SULFATE-MG SULF 17.5-3.13-1.6 GM/177ML PO SOLN: 1.0000 | Freq: Once | ORAL | 0 refills | Status: AC

## 2023-12-22 NOTE — Telephone Encounter (Signed)
Gastroenterology Pre-Procedure Review  Request Date: 01/12/24 Requesting Physician: Dr. Allegra Lai  PATIENT REVIEW QUESTIONS: The patient responded to the following health history questions as indicated:    1. Are you having any GI issues? no 2. Do you have a personal history of Polyps? no 3. Do you have a family history of Colon Cancer or Polyps? no 4. Diabetes Mellitus? no 5. Joint replacements in the past 12 months?no 6. Major health problems in the past 3 months?no 7. Any artificial heart valves, MVP, or defibrillator?no    MEDICATIONS & ALLERGIES:    Patient reports the following regarding taking any anticoagulation/antiplatelet therapy:   Plavix, Coumadin, Eliquis, Xarelto, Lovenox, Pradaxa, Brilinta, or Effient? no Aspirin? no  Patient confirms/reports the following medications:  Current Outpatient Medications  Medication Sig Dispense Refill   Na Sulfate-K Sulfate-Mg Sulfate concentrate 17.5-3.13-1.6 GM/177ML SOLN Take 1 kit by mouth once for 1 dose. 354 mL 0   albuterol (VENTOLIN HFA) 108 (90 Base) MCG/ACT inhaler Inhale 2 puffs into the lungs every 6 (six) hours as needed for wheezing or shortness of breath. 6.7 g 3   amLODipine (NORVASC) 10 MG tablet Take 1 tablet (10 mg total) by mouth daily. 60 tablet 3   aspirin 81 MG tablet Take by mouth.     cyproheptadine (PERIACTIN) 4 MG tablet Take 1 tablet (4 mg total) by mouth 3 (three) times daily as needed for allergies. 270 tablet 3   fluticasone-salmeterol (ADVAIR) 100-50 MCG/ACT AEPB Inhale 1 puff into the lungs 2 (two) times daily. 60 each 6   metoprolol tartrate (LOPRESSOR) 50 MG tablet Take 1 tablet (50 mg total) by mouth 2 (two) times daily. 60 tablet 4   Multiple Vitamin tablet      rosuvastatin (CRESTOR) 20 MG tablet Take 1 tablet (20 mg total) by mouth daily. 90 tablet 1   No current facility-administered medications for this visit.    Patient confirms/reports the following allergies:  Allergies  Allergen Reactions    Sulfa Antibiotics Swelling    swelling face and tongue   Fenofibrate Other (See Comments)    Foot pains    No orders of the defined types were placed in this encounter.   AUTHORIZATION INFORMATION Primary Insurance: 1D#: Group #:  Secondary Insurance: 1D#: Group #:  SCHEDULE INFORMATION: Date: 01/12/24 Time: Location: [facility]

## 2024-01-12 ENCOUNTER — Ambulatory Visit: Payer: Self-pay

## 2024-01-12 ENCOUNTER — Encounter: Payer: Self-pay | Admitting: Gastroenterology

## 2024-01-12 ENCOUNTER — Encounter
Admission: RE | Disposition: A | Discharge status: Home / Self Care | Payer: Self-pay | Source: Home / Self Care | Attending: Gastroenterology

## 2024-01-12 ENCOUNTER — Ambulatory Visit
Admission: RE | Admit: 2024-01-12 | Discharge: 2024-01-12 | Disposition: A | Discharge status: Home / Self Care | Payer: Managed Care, Other (non HMO) | Attending: Gastroenterology | Admitting: Gastroenterology

## 2024-01-12 DIAGNOSIS — J449 Chronic obstructive pulmonary disease, unspecified: Secondary | ICD-10-CM | POA: Diagnosis not present

## 2024-01-12 DIAGNOSIS — R195 Other fecal abnormalities: Secondary | ICD-10-CM | POA: Diagnosis not present

## 2024-01-12 DIAGNOSIS — I10 Essential (primary) hypertension: Secondary | ICD-10-CM | POA: Diagnosis not present

## 2024-01-12 DIAGNOSIS — F17210 Nicotine dependence, cigarettes, uncomplicated: Secondary | ICD-10-CM | POA: Insufficient documentation

## 2024-01-12 DIAGNOSIS — K644 Residual hemorrhoidal skin tags: Secondary | ICD-10-CM | POA: Diagnosis not present

## 2024-01-12 DIAGNOSIS — Z8673 Personal history of transient ischemic attack (TIA), and cerebral infarction without residual deficits: Secondary | ICD-10-CM | POA: Insufficient documentation

## 2024-01-12 DIAGNOSIS — Z1211 Encounter for screening for malignant neoplasm of colon: Secondary | ICD-10-CM | POA: Diagnosis not present

## 2024-01-12 HISTORY — DX: Chronic obstructive pulmonary disease, unspecified: J44.9

## 2024-01-12 HISTORY — DX: Cerebral infarction, unspecified: I63.9

## 2024-01-12 HISTORY — PX: COLONOSCOPY WITH PROPOFOL: SHX5780

## 2024-01-12 SURGERY — COLONOSCOPY WITH PROPOFOL
Anesthesia: General

## 2024-01-12 MED ORDER — LIDOCAINE HCL (CARDIAC) PF 100 MG/5ML IV SOSY
PREFILLED_SYRINGE | INTRAVENOUS | Status: DC | PRN
  Administered 2024-01-12: 80 mg via INTRAVENOUS

## 2024-01-12 MED ORDER — PROPOFOL 500 MG/50ML IV EMUL
INTRAVENOUS | Status: DC | PRN
Start: 2024-01-12 — End: 2024-01-12
  Administered 2024-01-12: 155 ug/kg/min via INTRAVENOUS

## 2024-01-12 MED ORDER — SODIUM CHLORIDE 0.9 % IV SOLN: INTRAVENOUS | Status: DC

## 2024-01-12 MED ORDER — PROPOFOL 1000 MG/100ML IV EMUL
INTRAVENOUS | Status: AC
  Filled 2024-01-12: qty 500

## 2024-01-12 MED ORDER — EPHEDRINE SULFATE-NACL 50-0.9 MG/10ML-% IV SOSY
PREFILLED_SYRINGE | INTRAVENOUS | Status: DC | PRN
  Administered 2024-01-12 (×2): 10 mg via INTRAVENOUS

## 2024-01-12 MED ORDER — PROPOFOL 10 MG/ML IV BOLUS
INTRAVENOUS | Status: DC | PRN
  Administered 2024-01-12: 60 mg via INTRAVENOUS

## 2024-01-12 NOTE — Transfer of Care (Addendum)
 Immediate Anesthesia Transfer of Care Note  Patient: Leah Shaw  Procedure(s) Performed: COLONOSCOPY WITH PROPOFOL  Patient Location: Endoscopy Unit  Anesthesia Type:General  Level of Consciousness: drowsy and patient cooperative  Airway & Oxygen Therapy: Patient Spontanous Breathing and Patient connected to face mask oxygen  Post-op Assessment: Report given to RN and Post -op Vital signs reviewed and stable  Post vital signs: Reviewed and stable  Last Vitals:  Vitals Value Taken Time  BP 86/58 01/12/24 0826  Temp 35.8 C 01/12/24 0826  Pulse 68 01/12/24 0829  Resp 16 01/12/24 0826  SpO2 100 % 01/12/24 0829  Vitals shown include unfiled device data.  Last Pain:  Vitals:   01/12/24 0826  TempSrc: Temporal  PainSc: Asleep         Complications: No notable events documented.

## 2024-01-12 NOTE — Anesthesia Procedure Notes (Signed)
 Procedure Name: General with mask airway Date/Time: 01/12/2024 8:02 AM  Performed by: Mohammed Kindle, CRNAPre-anesthesia Checklist: Patient identified, Emergency Drugs available, Suction available and Patient being monitored Patient Re-evaluated:Patient Re-evaluated prior to induction Oxygen Delivery Method: Simple face mask Induction Type: IV induction Placement Confirmation: positive ETCO2 and breath sounds checked- equal and bilateral Dental Injury: Teeth and Oropharynx as per pre-operative assessment

## 2024-01-12 NOTE — Anesthesia Preprocedure Evaluation (Signed)
 Anesthesia Evaluation  Patient identified by MRN, date of birth, ID band Patient awake    Reviewed: Allergy & Precautions, NPO status , Patient's Chart, lab work & pertinent test results  Airway Mallampati: II  TM Distance: >3 FB Neck ROM: full    Dental  (+) Teeth Intact   Pulmonary neg pulmonary ROS, COPD, Current Smoker and Patient abstained from smoking.   Pulmonary exam normal  + decreased breath sounds      Cardiovascular Exercise Tolerance: Good hypertension, Pt. on medications negative cardio ROS Normal cardiovascular exam Rhythm:Regular Rate:Normal     Neuro/Psych CVA, No Residual Symptoms negative neurological ROS  negative psych ROS   GI/Hepatic negative GI ROS, Neg liver ROS,,,  Endo/Other  negative endocrine ROS    Renal/GU negative Renal ROS  negative genitourinary   Musculoskeletal   Abdominal Normal abdominal exam  (+)   Peds negative pediatric ROS (+)  Hematology negative hematology ROS (+)   Anesthesia Other Findings Past Medical History: No date: Allergy No date: Hypertension  Past Surgical History: 2009: ABDOMINAL HYSTERECTOMY 1997: TUBAL LIGATION     Reproductive/Obstetrics negative OB ROS                             Anesthesia Physical Anesthesia Plan  ASA: 3  Anesthesia Plan: General   Post-op Pain Management:    Induction: Intravenous  PONV Risk Score and Plan: Propofol infusion and TIVA  Airway Management Planned: Natural Airway and Nasal Cannula  Additional Equipment:   Intra-op Plan:   Post-operative Plan:   Informed Consent: I have reviewed the patients History and Physical, chart, labs and discussed the procedure including the risks, benefits and alternatives for the proposed anesthesia with the patient or authorized representative who has indicated his/her understanding and acceptance.     Dental Advisory Given  Plan Discussed  with: CRNA and Surgeon  Anesthesia Plan Comments:        Anesthesia Quick Evaluation

## 2024-01-12 NOTE — Op Note (Signed)
 [facility] Gastroenterology Patient Name: Leah Shaw Procedure Date: 01/12/2024 7:55 AM MRN: [id number] Account #: [id number] Date of Birth: [birth year] Admit Type: Outpatient Age: 63 Room: [facility] ENDO ROOM 4 Gender: Female Note Status: Finalized Instrument Name: Colonoscope [id number] Procedure:             Colonoscopy Indications:           This is the patient's first colonoscopy, Positive                         Cologuard test Providers:             Toney Reil MD, MD Referring MD:          Sallee Provencal, FNP Medicines:             General Anesthesia Complications:         No immediate complications. Estimated blood loss: None. Procedure:             Pre-Anesthesia Assessment:                        - Prior to the procedure, a History and Physical was                         performed, and patient medications and allergies were                         reviewed. The patient is competent. The risks and                         benefits of the procedure and the sedation options and                         risks were discussed with the patient. All questions                         were answered and informed consent was obtained.                         Patient identification and proposed procedure were                         verified by the physician, the nurse, the                         anesthesiologist, the anesthetist and the technician                         in the pre-procedure area in the procedure room in the                         endoscopy suite. Mental Status Examination: alert and                         oriented. Airway Examination: normal oropharyngeal                         airway and neck mobility. Respiratory Examination:  clear to auscultation. CV Examination: normal.                         Prophylactic Antibiotics: The patient does not require                         prophylactic antibiotics. Prior  Anticoagulants: The                         patient has taken no anticoagulant or antiplatelet                         agents. ASA Grade Assessment: III - A patient with                         severe systemic disease. After reviewing the risks and                         benefits, the patient was deemed in satisfactory                         condition to undergo the procedure. The anesthesia                         plan was to use general anesthesia. Immediately prior                         to administration of medications, the patient was                         re-assessed for adequacy to receive sedatives. The                         heart rate, respiratory rate, oxygen saturations,                         blood pressure, adequacy of pulmonary ventilation, and                         response to care were monitored throughout the                         procedure. The physical status of the patient was                         re-assessed after the procedure.                        After obtaining informed consent, the colonoscope was                         passed under direct vision. Throughout the procedure,                         the patient's blood pressure, pulse, and oxygen                         saturations were monitored continuously. The  Colonoscope was introduced through the anus and                         advanced to the the cecum, identified by appendiceal                         orifice and ileocecal valve. The colonoscopy was                         performed without difficulty. The patient tolerated                         the procedure well. The quality of the bowel                         preparation was evaluated using the BBPS [facility] Bowel                         Preparation Scale) with scores of: Right Colon = 3,                         Transverse Colon = 3 and Left Colon = 3 (entire mucosa                         seen well with no  residual staining, small fragments                         of stool or opaque liquid). The total BBPS score                         equals 9. The ileocecal valve, appendiceal orifice,                         and rectum were photographed. Findings:      The perianal and digital rectal examinations were normal. Pertinent       negatives include normal sphincter tone and no palpable rectal lesions.      Non-bleeding external hemorrhoids were found during retroflexion. The       hemorrhoids were medium-sized.      The exam was otherwise without abnormality. Impression:            - Non-bleeding external hemorrhoids.                        - The examination was otherwise normal.                        - No specimens collected. Recommendation:        - Discharge patient to home (with escort).                        - Resume previous diet today.                        - Continue present medications.                        - Repeat colonoscopy in 10 years for screening  purposes. Procedure Code(s):     --- Professional ---                        [phone], Colonoscopy, flexible; diagnostic, including                         collection of specimen(s) by brushing or washing, when                         performed (separate procedure) Diagnosis Code(s):     --- Professional ---                        K64.4, Residual hemorrhoidal skin tags                        R19.5, Other fecal abnormalities CPT copyright 2022 American Medical Association. All rights reserved. The codes documented in this report are preliminary and upon coder review may  be revised to meet current compliance requirements. Dr. Libby Maw Toney Reil MD, MD 01/12/2024 8:24:39 AM This report has been signed electronically. Number of Addenda: 0 Note Initiated On: 01/12/2024 7:55 AM Scope Withdrawal Time: 0 hours 5 minutes 54 seconds  Total Procedure Duration: 0 hours 10 minutes 4 seconds  Estimated  Blood Loss:  Estimated blood loss: none.      [facility]

## 2024-01-12 NOTE — Anesthesia Postprocedure Evaluation (Signed)
 Anesthesia Post Note  Patient: Leah Shaw  Procedure(s) Performed: COLONOSCOPY WITH PROPOFOL  Patient location during evaluation: PACU Anesthesia Type: General Level of consciousness: awake and awake and alert Pain management: satisfactory to patient Vital Signs Assessment: post-procedure vital signs reviewed and stable Respiratory status: spontaneous breathing Cardiovascular status: blood pressure returned to baseline Anesthetic complications: no   No notable events documented.   Last Vitals:  Vitals:   01/12/24 0826 01/12/24 0829  BP: (!) 86/58 (!) 86/58  Pulse: 70 68  Resp: 16   Temp: (!) 35.8 C   SpO2: 100% 100%    Last Pain:  Vitals:   01/12/24 0826  TempSrc: Temporal  PainSc: Asleep                 VAN STAVEREN,Veyda Kaufman

## 2024-01-12 NOTE — H&P (Signed)
 Arlyss Repress, MD [address]  [address]  [place], [place] [address]  Main: [phone]  Fax: [phone] Pager: [phone]  Primary Care Physician:  Sallee Provencal, FNP Primary Gastroenterologist:  Dr. Arlyss Repress  Pre-Procedure History & Physical: HPI:  Leah Shaw is a 63 y.o. female is here for an colonoscopy.   Past Medical History:  Diagnosis Date   Allergy    COPD (chronic obstructive pulmonary disease) (HCC)    Hypertension    Stroke [facility])     Past Surgical History:  Procedure Laterality Date   ABDOMINAL HYSTERECTOMY  2009   TUBAL LIGATION  1997    Prior to Admission medications   Medication Sig Start Date End Date Taking? Authorizing Provider  albuterol (VENTOLIN HFA) 108 (90 Base) MCG/ACT inhaler Inhale 2 puffs into the lungs every 6 (six) hours as needed for wheezing or shortness of breath. 11/29/23  Yes Charlcie Cradle A, FNP  amLODipine (NORVASC) 10 MG tablet Take 1 tablet (10 mg total) by mouth daily. 11/29/23  Yes Charlcie Cradle A, FNP  aspirin 81 MG tablet Take by mouth.   Yes [provider]  cyproheptadine (PERIACTIN) 4 MG tablet Take 1 tablet (4 mg total) by mouth 3 (three) times daily as needed for allergies. 11/29/23  Yes Charlcie Cradle A, FNP  metoprolol tartrate (LOPRESSOR) 50 MG tablet Take 1 tablet (50 mg total) by mouth 2 (two) times daily. 11/29/23  Yes Charlcie Cradle A, FNP  rosuvastatin (CRESTOR) 20 MG tablet Take 1 tablet (20 mg total) by mouth daily. 11/30/23  Yes Charlcie Cradle A, FNP  fluticasone-salmeterol (ADVAIR) 100-50 MCG/ACT AEPB Inhale 1 puff into the lungs 2 (two) times daily. 12/02/23   Sallee Provencal, FNP  Multiple Vitamin tablet  10/11/06   [provider]    Allergies as of 12/22/2023 - Review Complete 11/29/2023  Allergen Reaction Noted   Sulfa antibiotics Swelling 12/19/2015   Fenofibrate Other (See Comments) 06/06/2018    Family History  Problem Relation Age of Onset   CAD Mother     Heart disease Mother    COPD Father    Heart attack Father    Breast cancer Sister 86   Healthy Sister    Polycythemia Half-Brother    Seizures Half-Brother    Healthy Daughter    Healthy Son    Heart attack Maternal Grandmother    Emphysema Paternal Grandmother     Social History   Socioeconomic History   Marital status: Married    Spouse name: Not on file   Number of children: Not on file   Years of education: Not on file   Highest education level: Not on file  Occupational History   Not on file  Tobacco Use   Smoking status: Every Day    Current packs/day: 1.00    Average packs/day: 1 pack/day for 30.0 years (30.0 ttl pk-yrs)    Types: Cigarettes   Smokeless tobacco: Never  Vaping Use   Vaping status: Never Used  Substance and Sexual Activity   Alcohol use: No    Alcohol/week: 0.0 standard drinks of alcohol   Drug use: No   Sexual activity: Not on file  Other Topics Concern   Not on file  Social History Narrative   Not on file   Social Drivers of Health   Financial Resource Strain: Not on file  Food Insecurity: Not on file  Transportation Needs: Not on file  Physical Activity: Not on file  Stress:  Not on file  Social Connections: Not on file  Intimate Partner Violence: Not on file    Review of Systems: See HPI, otherwise negative ROS  Physical Exam: BP (!) 140/91   Pulse 63   Temp (!) 96.4 F (35.8 C) (Temporal)   Resp 16   Wt 56.7 kg   SpO2 99%   BMI 22.14 kg/m  General:   Alert,  pleasant and cooperative in NAD Head:  Normocephalic and atraumatic. Neck:  Supple; no masses or thyromegaly. Lungs:  Clear throughout to auscultation.    Heart:  Regular rate and rhythm. Abdomen:  Soft, nontender and nondistended. Normal bowel sounds, without guarding, and without rebound.   Neurologic:  Alert and  oriented x4;  grossly normal neurologically.  Impression/Plan: [name] is here for an colonoscopy to be performed for positive  cologuard  Risks, benefits, limitations, and alternatives regarding  colonoscopy have been reviewed with the patient.  Questions have been answered.  All parties agreeable.   Lannette Donath, MD  01/12/2024, 7:51 AM

## 2024-01-13 ENCOUNTER — Encounter: Payer: Self-pay | Admitting: Gastroenterology

## 2024-01-21 ENCOUNTER — Telehealth: Payer: Self-pay | Admitting: Family Medicine

## 2024-01-21 DIAGNOSIS — E782 Mixed hyperlipidemia: Secondary | ICD-10-CM

## 2024-01-21 DIAGNOSIS — I10 Essential (primary) hypertension: Secondary | ICD-10-CM

## 2024-01-21 DIAGNOSIS — J449 Chronic obstructive pulmonary disease, unspecified: Secondary | ICD-10-CM

## 2024-01-21 DIAGNOSIS — J329 Chronic sinusitis, unspecified: Secondary | ICD-10-CM

## 2024-01-21 MED ORDER — METOPROLOL TARTRATE 50 MG PO TABS: 50.0000 mg | ORAL_TABLET | Freq: Two times a day (BID) | ORAL | 4 refills | Status: DC

## 2024-01-21 MED ORDER — ROSUVASTATIN CALCIUM 20 MG PO TABS: 20.0000 mg | ORAL_TABLET | Freq: Every day | ORAL | 1 refills | Status: DC

## 2024-01-21 MED ORDER — ALBUTEROL SULFATE HFA 108 (90 BASE) MCG/ACT IN AERS: 2.0000 | INHALATION_SPRAY | Freq: Four times a day (QID) | RESPIRATORY_TRACT | 3 refills | Status: DC | PRN

## 2024-01-21 MED ORDER — CYPROHEPTADINE HCL 4 MG PO TABS: 4.0000 mg | ORAL_TABLET | Freq: Three times a day (TID) | ORAL | 3 refills | Status: AC | PRN

## 2024-01-21 NOTE — Telephone Encounter (Signed)
 Talked with pt and advised. Rx will now be sent to [facility] delivery

## 2024-01-21 NOTE — Telephone Encounter (Signed)
 [facility] faxed refill request for the following medications:  Wixela Inhub Aer   albuterol (VENTOLIN HFA) 108 (90 Base) MCG/ACT inhaler    cyproheptadine (PERIACTIN) 4 MG tablet    amLODipine (NORVASC) 10 MG tablet     rosuvastatin (CRESTOR) 20 MG tablet     metoprolol tartrate (LOPRESSOR) 50 MG tablet    Please advise.

## 2024-01-25 NOTE — Telephone Encounter (Signed)
 Received another fax from [facility] requesting the following medications   amLODipine (NORVASC) 10 MG tablet.    Wixela Inhub Aer  Does not look like it was sent to [facility]

## 2024-01-26 MED ORDER — AMLODIPINE BESYLATE 10 MG PO TABS
10.0000 mg | ORAL_TABLET | Freq: Every day | ORAL | 3 refills | Status: DC
Start: 2024-01-26 — End: 2024-09-13

## 2024-01-26 MED ORDER — FLUTICASONE-SALMETEROL 100-50 MCG/ACT IN AEPB
1.0000 | INHALATION_SPRAY | Freq: Two times a day (BID) | RESPIRATORY_TRACT | 6 refills | Status: AC
Start: 2024-01-26 — End: ?

## 2024-01-26 NOTE — Addendum Note (Signed)
 Addended by: Lily Kocher on: 01/26/2024 09:10 AM   Modules accepted: Orders

## 2024-01-26 NOTE — Addendum Note (Signed)
 Addended by: Lily Kocher on: 01/26/2024 01:31 PM   Modules accepted: Orders

## 2024-04-21 ENCOUNTER — Other Ambulatory Visit: Payer: Self-pay | Admitting: Family Medicine

## 2024-04-21 DIAGNOSIS — I10 Essential (primary) hypertension: Secondary | ICD-10-CM

## 2024-05-29 ENCOUNTER — Ambulatory Visit: Payer: Self-pay | Admitting: Family Medicine

## 2024-06-16 ENCOUNTER — Ambulatory Visit: Admitting: Family Medicine

## 2024-06-16 ENCOUNTER — Encounter: Payer: Self-pay | Admitting: Family Medicine

## 2024-06-16 VITALS — BP 108/77 | HR 63 | Temp 98.1°F | Ht 63.0 in | Wt 128.8 lb

## 2024-06-16 DIAGNOSIS — I10 Essential (primary) hypertension: Secondary | ICD-10-CM

## 2024-06-16 DIAGNOSIS — E782 Mixed hyperlipidemia: Secondary | ICD-10-CM | POA: Diagnosis not present

## 2024-06-16 DIAGNOSIS — F17200 Nicotine dependence, unspecified, uncomplicated: Secondary | ICD-10-CM

## 2024-06-16 DIAGNOSIS — J449 Chronic obstructive pulmonary disease, unspecified: Secondary | ICD-10-CM | POA: Diagnosis not present

## 2024-06-16 DIAGNOSIS — Z5320 Procedure and treatment not carried out because of patient's decision for unspecified reasons: Secondary | ICD-10-CM

## 2024-06-16 MED ORDER — ALBUTEROL SULFATE HFA 108 (90 BASE) MCG/ACT IN AERS: 2.0000 | INHALATION_SPRAY | Freq: Four times a day (QID) | RESPIRATORY_TRACT | 3 refills | Status: AC | PRN

## 2024-06-16 NOTE — Assessment & Plan Note (Signed)
 Addressed the tobacco cessation referral process as well as 1 800 QUIT NOW and pt continued to have hesitation

## 2024-06-16 NOTE — Progress Notes (Signed)
 Established Patient Office Visit  Introduced to nurse practitioner role and practice setting.  All questions answered.  Discussed provider/patient relationship and expectations.  Subjective   Patient ID: Leah Shaw, female    DOB: Dec 15, 1960  Age: 63 y.o. MRN: [id number]  Chief Complaint  Patient presents with   Medical Management of Chronic Issues   Hypertension   Hyperlipidemia   Discussed the use of AI scribe software for clinical note transcription with the patient, who gave verbal consent to proceed.  History of Present Illness Leah Shaw is a 63 year old female with hypertension and asthma who presents for medication management and follow-up.  She is currently taking Norvasc  10 mg and metoprolol  50 mg for hypertension. No headaches or vision changes.  She has asthma and has been experiencing difficulty affording her inhalers due to high costs. The inhalers were priced at $350, which she cannot afford, leading to non-use. She feels fine unless exerting herself, particularly [place]. Her breathing is more affected now than before, possibly due to increased physical exertion and stress from caring for her immobile sister who had a stroke.  She is on Crestor  20 mg for hyperlipidemia. She has had a colonoscopy recently, which was normal, and she is due for a repeat in ten years. She has not yet scheduled a mammogram or DEXA scan but has the orders available if she decides to proceed.       06/16/2024    1:21 PM 11/29/2023   10:34 AM 10/14/2023    8:40 AM  Depression screen PHQ 2/9  Decreased Interest 0 0 0  Down, Depressed, Hopeless 0 0 0  PHQ - 2 Score 0 0 0  Altered sleeping 0 0 0  Tired, decreased energy 0 1 0  Change in appetite 0 0 0  Feeling bad or failure about yourself  0 0 0  Trouble concentrating 0 0 0  Moving slowly or fidgety/restless 0 0 0  Suicidal thoughts 0 0 0  PHQ-9 Score 0 1 0  Difficult doing  work/chores  Not difficult at all Not difficult at all       06/16/2024    1:22 PM 11/29/2023   10:34 AM  GAD 7 : Generalized Anxiety Score  Nervous, Anxious, on Edge 1 0  Control/stop worrying 0 0  Worry too much - different things 0 0  Trouble relaxing 0 0  Restless 0 0  Easily annoyed or irritable 0 0  Afraid - awful might happen 0 0  Total GAD 7 Score 1 0  Anxiety Difficulty Not difficult at all      ROS  Negative unless indicated in HPI   Objective:     BP 108/77 (BP Location: Left Arm, Patient Position: Sitting, Cuff Size: Normal)   Pulse 63   Temp 98.1 F (36.7 C) (Oral)   Ht 5' 3 (1.6 m)   Wt 128 lb 12.8 oz (58.4 kg)   SpO2 97%   BMI 22.82 kg/m    Physical Exam Constitutional:      General: She is not in acute distress.    Appearance: Normal appearance. She is normal weight. She is not ill-appearing, toxic-appearing or diaphoretic.  HENT:     Head: Normocephalic.     Nose: Nose normal.     Mouth/Throat:     Mouth: Mucous membranes are moist.     Pharynx: Oropharynx is clear.  Eyes:  Extraocular Movements: Extraocular movements intact.     Pupils: Pupils are equal, round, and reactive to light.  Neck:     Vascular: No carotid bruit.  Cardiovascular:     Rate and Rhythm: Normal rate and regular rhythm.     Pulses: Normal pulses.     Heart sounds: Normal heart sounds. No murmur heard.    No friction rub. No gallop.  Pulmonary:     Effort: Pulmonary effort is normal. No respiratory distress.     Breath sounds: Normal breath sounds. No stridor. No wheezing, rhonchi or rales.  Chest:     Chest wall: No tenderness.  Musculoskeletal:        General: Normal range of motion.     Cervical back: No rigidity or tenderness.     Right lower leg: No edema.     Left lower leg: No edema.  Lymphadenopathy:     Cervical: No cervical adenopathy.  Skin:    General: Skin is warm and dry.     Capillary Refill: Capillary refill takes less than 2 seconds.      Coloration: Skin is not jaundiced or pale.     Findings: No bruising, erythema, lesion or rash.  Neurological:     General: No focal deficit present.     Mental Status: She is alert and oriented to person, place, and time. Mental status is at baseline.     Cranial Nerves: No cranial nerve deficit.     Sensory: No sensory deficit.     Motor: No weakness.     Gait: Gait normal.  Psychiatric:        Mood and Affect: Mood normal.        Behavior: Behavior normal.        Thought Content: Thought content normal.        Judgment: Judgment normal.      No results found for any visits on 06/16/24.    The ASCVD Risk score (Arnett DK, et al., 2019) failed to calculate for the following reasons:   Risk score cannot be calculated because patient has a medical history suggesting prior/existing ASCVD    Assessment & Plan:  Primary hypertension -     Basic metabolic panel with GFR  Mixed hyperlipidemia -     Lipid panel  Chronic obstructive pulmonary disease, unspecified COPD type (HCC) -     Albuterol  Sulfate HFA; Inhale 2 puffs into the lungs every 6 (six) hours as needed for wheezing or shortness of breath.  Dispense: 6.7 g; Refill: 3 -     AMB Referral [facility] Care Management  Lung cancer screening declined by patient Assessment & Plan: Recommend Pt declines, acknowledges risk vs benefit   Compulsive tobacco user syndrome Assessment & Plan: Addressed the tobacco cessation referral process as well as 1 800 QUIT NOW and pt continued to have hesitation      Assessment and Plan Assessment & Plan COPD Management complicated by inhaler cost, leading to non-adherence. Symptoms controlled except during exertion. - Provided coupon for Albuterol  inhaler- goodrx to [facility]. - Sent Albuterol  prescription to [facility] at [address] and [address]. - [facility] pharmacist - assistance with cost of Advair and Albuterol  inhalers.  Hypertension Hypertension well-controlled on Norvasc  and  Metoprolol . No headaches or vision changes. - Continue Norvasc  10 mg daily. - Continue Metoprolol  50 mg daily. - will check CMP today  Hyperlipidemia Hyperlipidemia managed with Crestor . No new concerns. - Continue Crestor  20 mg daily. - Will check LP today  General  Health Maintenance Colonoscopy normal. Orders for mammogram and DEXA scan available. Discussed Pap smear for future. - Encourage scheduling of mammogram and DEXA scan. - Consider Pap smear during next physical examination.  Return in about 6 months (around 12/17/2024) for annual physical.   I, Curtis DELENA Boom, FNP, have reviewed all documentation for this visit. The documentation on 06/16/24 for the exam, diagnosis, procedures, and orders are all accurate and complete.  Curtis DELENA Boom, FNP

## 2024-06-16 NOTE — Assessment & Plan Note (Signed)
 Recommend Pt declines, acknowledges risk vs benefit

## 2024-06-17 LAB — BASIC METABOLIC PANEL WITH GFR
BUN/Creatinine Ratio: 16 (ref 12–28)
BUN: 14 mg/dL (ref 8–27)
CO2: 19 mmol/L — ABNORMAL LOW (ref 20–29)
Calcium: 9.4 mg/dL (ref 8.7–10.3)
Chloride: 106 mmol/L (ref 96–106)
Creatinine, Ser: 0.88 mg/dL (ref 0.57–1.00)
Glucose: 83 mg/dL (ref 70–99)
Potassium: 4.1 mmol/L (ref 3.5–5.2)
Sodium: 140 mmol/L (ref 134–144)
eGFR: 75 mL/min/1.73 (ref >59)

## 2024-06-17 LAB — LIPID PANEL
Chol/HDL Ratio: 2.8 ratio (ref 0.0–4.4)
Cholesterol, Total: 149 mg/dL (ref 100–199)
HDL: 53 mg/dL (ref >39)
LDL Chol Calc (NIH): 69 mg/dL (ref 0–99)
Triglycerides: 157 mg/dL — ABNORMAL HIGH (ref 0–149)
VLDL Cholesterol Cal: 27 mg/dL (ref 5–40)

## 2024-06-19 ENCOUNTER — Ambulatory Visit: Payer: Self-pay | Admitting: Family Medicine

## 2024-06-26 ENCOUNTER — Telehealth: Payer: Self-pay

## 2024-06-26 ENCOUNTER — Other Ambulatory Visit: Payer: Self-pay

## 2024-06-26 NOTE — Progress Notes (Unsigned)
 Attempted to outreach patient regarding medication cost for albuterol  and Advair inhalers. Left voicemail. Will try again at a later time.  Abagail Limb E. Marsh, PharmD Clinical Pharmacist [facility] [phone]

## 2024-06-27 NOTE — Progress Notes (Signed)
 Opened in error

## 2024-06-30 NOTE — Telephone Encounter (Signed)
 Brief Telephone Documentation Reason for Call: Pharmacist was consulted for medication access assistance regarding cost of inhalers  Summary of Call: No answer, left voicemail with direct callback number.   Sent MyChart message regarding inhaler cost. Per pharmacy, Albuterol  is $10 on insurance and Advair is ~$20 on insurance.   Jerusha Reising E. Marsh, PharmD Clinical Pharmacist [facility] [phone]

## 2024-07-08 ENCOUNTER — Other Ambulatory Visit: Payer: Self-pay | Admitting: Family Medicine

## 2024-07-08 DIAGNOSIS — I10 Essential (primary) hypertension: Secondary | ICD-10-CM

## 2024-09-12 ENCOUNTER — Other Ambulatory Visit: Payer: Self-pay | Admitting: Family Medicine

## 2024-09-12 DIAGNOSIS — I10 Essential (primary) hypertension: Secondary | ICD-10-CM

## 2024-09-12 DIAGNOSIS — E782 Mixed hyperlipidemia: Secondary | ICD-10-CM

## 2024-12-18 ENCOUNTER — Encounter: Admitting: Family Medicine
